# Patient Record
Sex: Female | Born: 1960
Health system: Southern US, Community
[De-identification: ages and names within clinical notes are randomized; demographics above are authoritative.]

## PROBLEM LIST (undated history)

## (undated) DIAGNOSIS — T7840XA Allergy, unspecified, initial encounter: Secondary | ICD-10-CM

## (undated) HISTORY — DX: Allergy, unspecified, initial encounter: T78.40XA

---

## 2001-08-23 ENCOUNTER — Encounter: Admission: RE | Admit: 2001-08-23 | Discharge: 2001-08-23 | Payer: Self-pay | Admitting: Family Medicine

## 2001-08-23 ENCOUNTER — Encounter: Payer: Self-pay | Admitting: Family Medicine

## 2002-02-14 ENCOUNTER — Other Ambulatory Visit: Admission: RE | Admit: 2002-02-14 | Discharge: 2002-02-14 | Payer: Self-pay | Admitting: Obstetrics

## 2002-11-13 ENCOUNTER — Encounter: Payer: Self-pay | Admitting: Family Medicine

## 2002-11-13 ENCOUNTER — Encounter: Admission: RE | Admit: 2002-11-13 | Discharge: 2002-11-13 | Payer: Self-pay | Admitting: Family Medicine

## 2002-11-27 ENCOUNTER — Emergency Department (HOSPITAL_COMMUNITY): Admission: EM | Admit: 2002-11-27 | Discharge: 2002-11-27 | Payer: Self-pay | Admitting: Emergency Medicine

## 2003-05-07 ENCOUNTER — Encounter: Payer: Self-pay | Admitting: Family Medicine

## 2003-05-07 ENCOUNTER — Encounter: Admission: RE | Admit: 2003-05-07 | Discharge: 2003-05-07 | Payer: Self-pay | Admitting: Family Medicine

## 2003-06-29 ENCOUNTER — Ambulatory Visit (HOSPITAL_COMMUNITY): Admission: RE | Admit: 2003-06-29 | Discharge: 2003-06-29 | Payer: Self-pay | Admitting: Obstetrics & Gynecology

## 2004-02-12 ENCOUNTER — Ambulatory Visit (HOSPITAL_BASED_OUTPATIENT_CLINIC_OR_DEPARTMENT_OTHER): Admission: RE | Admit: 2004-02-12 | Discharge: 2004-02-12 | Payer: Self-pay | Admitting: Orthopedic Surgery

## 2008-04-20 ENCOUNTER — Emergency Department (HOSPITAL_COMMUNITY): Admission: EM | Admit: 2008-04-20 | Discharge: 2008-04-20 | Payer: Self-pay | Admitting: Emergency Medicine

## 2008-04-22 ENCOUNTER — Emergency Department (HOSPITAL_COMMUNITY): Admission: EM | Admit: 2008-04-22 | Discharge: 2008-04-22 | Payer: Self-pay | Admitting: Emergency Medicine

## 2009-12-24 ENCOUNTER — Encounter
Admission: RE | Admit: 2009-12-24 | Discharge: 2009-12-24 | Payer: Self-pay | Source: Home / Self Care | Admitting: Family Medicine

## 2010-09-11 ENCOUNTER — Ambulatory Visit (HOSPITAL_BASED_OUTPATIENT_CLINIC_OR_DEPARTMENT_OTHER)
Admission: RE | Admit: 2010-09-11 | Discharge: 2010-09-11 | Disposition: A | Discharge status: Home / Self Care | Payer: BC Managed Care – PPO | Source: Ambulatory Visit | Attending: Urology | Admitting: Urology

## 2010-09-11 DIAGNOSIS — N3946 Mixed incontinence: Secondary | ICD-10-CM | POA: Insufficient documentation

## 2010-09-11 DIAGNOSIS — IMO0002 Reserved for concepts with insufficient information to code with codable children: Secondary | ICD-10-CM | POA: Insufficient documentation

## 2010-09-11 DIAGNOSIS — Z01810 Encounter for preprocedural cardiovascular examination: Secondary | ICD-10-CM | POA: Insufficient documentation

## 2010-09-11 DIAGNOSIS — N3010 Interstitial cystitis (chronic) without hematuria: Secondary | ICD-10-CM | POA: Insufficient documentation

## 2010-09-11 DIAGNOSIS — Z01812 Encounter for preprocedural laboratory examination: Secondary | ICD-10-CM | POA: Insufficient documentation

## 2010-09-11 LAB — POCT I-STAT 4, (NA,K, GLUC, HGB,HCT): HCT: 38 % (ref 36.0–46.0)

## 2010-09-25 NOTE — Op Note (Signed)
  NAMEBILLIE, TRAGER               ACCOUNT NO.:  [id number]  MEDICAL RECORD NO.:  [id number]          PATIENT TYPE:  INP  LOCATION:                               FACILITY:  [facility]  PHYSICIAN:  Martina Sinner, MD DATE OF BIRTH:  10/02/1960  DATE OF PROCEDURE: DATE OF DISCHARGE:                              OPERATIVE REPORT   PREOPERATIVE DIAGNOSIS:  Pelvic pain.  POSTOPERATIVE DIAGNOSIS:  Interstitial cystitis.  SURGERY:  Cystoscopy, hydrodistention of bladder, instillation therapy.  Ms. Losey has mixed stress and urge incontinence.  Both are mild.  She has pelvic pain and dyspareunia.  The patient was prepped and draped in usual fashion.  Leg position was good.  She had grade 1 or mild grade 2 cystocele that would not require repair.  She had a sling.  A 21-French scope was utilized.  Bladder mucosa and trigone were normal. There was no stitch foreign, body or carcinoma.  She was hydrodistented to 550 mL.  I emptied her bladder and reinspected her bladder and she had diffuse glomerulations keeping the diagnosis of interstitial cystitis.  Bladder was emptied.  As a separate procedure, a red rubber catheter was inserted.  I instilled 15 cc of 0.5% Marcaine plus 400 mg of Pyridium.  The patient will be treated for mixed stress urge incontinence and pelvic pain with physical therapy, behavioral therapy, and medication, hoping to stop medication in the future.          ______________________________ Martina Sinner, MD     SAM/MEDQ  D:  09/11/2010  T:  09/11/2010  Job:  [id number]  Electronically Signed by Alfredo Martinez MD on 09/25/2010 12:49:48 PM

## 2010-11-28 NOTE — Op Note (Signed)
NAME:  Selena Huff, Selena Huff                         ACCOUNT NO.:  [id number]   MEDICAL RECORD NO.:  [id number]                   PATIENT TYPE:  AMB   LOCATION:  DSC                                  FACILITY:  [facility]   PHYSICIAN:  Katy Fitch. Naaman Plummer., M.D.          DATE OF BIRTH:  11-24-1960   DATE OF PROCEDURE:  02/12/2004  DATE OF DISCHARGE:                                 OPERATIVE REPORT   PREOPERATIVE DIAGNOSES:  Clinical and MRI-proven retracted rotator cuff  tear, right shoulder, with significant acromioclavicular degenerative  arthritis and type 3 acromion.   POSTOPERATIVE DIAGNOSES:  Clinical and MRI-proven retracted rotator cuff  tear, right shoulder, with significant acromioclavicular degenerative  arthritis and type 3 acromion, with identification of degenerative tear of  labrum at 1 o'clock through 10 o'clock posteriorly.   OPERATION:  1. Diagnostic arthroscopy of right shoulder.  2. Arthroscopic debridement of labral and deep surface rotator cuff tears.  3. Open reconstruction of chronically-retracted right supra-spinatus and     infra-spinatus rotator cuff tear, with  bio-corkscrew anchor fixation x2,     and Revo titanium screw fixation into greater tuberosity.  4. Open resection of distal clavicle, i.e. Mumford procedure.   SURGEON:  Katy Fitch. Sypher, M.D.   ASSISTANT:  None.   ANESTHESIA:  General endotracheal supplemented by intra-Scalene block.   ANESTHESIOLOGIST:  Guadalupe Maple, M.D.   INDICATIONS FOR PROCEDURE:  The patient is Huff 50 year old woman referred by  Dr. Cindee Salt and Dr. Renaye Rakers, for the evaluation and management of Huff  painful right shoulder.  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  She was seen by Dr. Merlyn Lot in the  fall of 2004.  She had an x-ray evaluation of her shoulder demonstrating AC  arthropathy and signs of probable impingement.  She was treated with Huff sub-  acromial injection  of steroid, followed by Huff course of therapy.  She did not  improve.  Dr. Merlyn Lot then referred her for an MRI which documented Huff rather  substantial retracted rotator cuff tear.  He has subsequently referred her  for an upper extremity orthopedic consultation with myself.   The clinical examination revealed weakness of abduction, pain on abduction,  external rotation against resistance and pain on crossed __________  reduction consistent with impingement.  Plain films documented Huff type 3  acromion, AC arthropathy and signs of retracted rotator cuff tear with  sclerosis of the greater tuberosity of the humerus.  The MRI revealed Huff  large retracted rotator cuff tear.   INFORMED CONSENT:  After informed consent the patient is brought to the  operating room at this time for Huff reconstruction of her right shoulder.  Preoperatively questions were invited and answered.  She was informed of the  potential risks and benefits of the surgery in detail.   DESCRIPTION OF PROCEDURE:  The patient is brought  to the operating room and  placed in the supine position upon the operating room table.  An intra-  Scalene block placed in the holding area lead to complete anesthesia of the  right fore-quarter.  General orotracheal anesthesia was induced followed by  positioning the patient in the beach chair position with the aid of Huff torso  and head-holder to perform the arthroscopy.  The entire right upper  extremity and fore-quarter were prepped with DuraPrep and draped with  impervious arthroscopy drapes.  The procedure commenced with distention of  the shoulder with 20 mL of sterile saline brought in through Huff posterior  approach, utilizing Huff #20 gauge spinal needle.  The scope was placed  atraumatically, followed by Huff diagnostic arthroscopy of the glenohumeral  joint.  The hyaline articular cartilage surfaces on the humerus and glenoid  were normal.  The biceps tendon had Huff normal origin at the superior  labrum;  however, the labrum was rather degenerative with Huff blunt edge at 1 o'clock  anteriorly with Huff free fragment hanging, and significant degeneration was  noted from 1 o'clock anteriorly to 10 o'clock posteriorly.  The anterior  glenohumeral ligaments were inspected and found to be normal.  The sub-  scapularis tendon was normal.  The supra-spinatus and infra-spinatus had Huff  retracted degenerative tear that was pulled off the greater tuberosity Huff  minimum of 20 mm.  There was Huff large osteophyte at the greater tuberosity  that had formed, due to chronic impingement beneath the [facility] joint.  Huff suction shaver was brought in through an anterior portal and used to  debride the labrum and deep surface of the rotator cuff tear.  After  assuring an intact biceps tendon through the rotator interval, the  arthroscopic equipment was removed, followed by progressing to an open  rotator cuff repair.  Huff 5 cm incision was fashioned at the distal clavicle  to the anterior middle deltoid junction laterally.  The [facility] joint was  identified and the distal 15 mm of clavicle exposed subperiosteally with the  #15 blade and Huff small osteotome.  The baby Willeen Cass retractors were placed,  followed by the use of an oscillating saw to remove the distal 15 mm of  clavicle.  There was considerable degeneration at the articular surface with  Huff large inferior spur.  The anterior deltoid was taken carefully off of the  anterior acromion subperiosteally, followed by the release of the  coracoacromial ligament.  The bursa was incised, revealing Huff large retracted  cuff tear.  Care was taken to identify and protect the biceps tendon.  The  cuff tear margins were freshened with Huff #15 scalpel blade, followed by the  use of Huff power bur to lower the profile of the tuberosity 3 to 4 mm deep to  the insertion of the infra-spinatus and supra-spinatus, followed by the use of Huff rongeur, removing the large lateral osteophyte that had  formed.  Two Arthrax bicortical screw anchors were placed medially to create Huff proper  footprint for cuff repair, followed by use of Huff titanium Revo screw  laterally, due to extremely dense bone at the site of chronic impingement.  Huff #2 fiber wire was placed with Huff grasping technique and placed through bone  in Huff McLaughlin fashion, to advance the rotator cuff laterally to its proper  insertion point.  Huff total of six mattress sutures were then placed,  insetting the supra-spinatus and infra-spinatus onto the decorticated area  of the greater tuberosity.  Huff very satisfactory profile was achieved.  Huff  finishing suture along the margin was used to smooth the inset point at the  greater tuberosity.  The bursa was then reapproximated with repair of the  anterior deltoid, with mattress sutures of #2 fiber wire, closing the dead  space created by distal clavicle resection, and securely repairing the  anterior deltoid with three bone sutures to the acromion.  The deltoid split  laterally was repaired with Huff single simple #0 Vicryl suture.  The deltoid  was not split more than 1 cm.  The wound was thoroughly lavaged with sterile  saline, followed by Huff repair of the subcutaneous tissues with #0 Vicryl and  #2-0 Vicryl with knots buried, followed by Huff repair of the skin with  intradermal #3-0 Prolene and Steri-Strips.  Huff pressure dressing was applied  with Xeroflo, sterile gauze and Hypafix. There were no apparent  complications.  The patient tolerated the surgery and the anesthesia well.  She was  transferred to the recovery room with stable vital signs.   AFTER-CARE:  She will be admitted to the recovery care center for  observation of her vital signs, IV antibiotics in the form of Ancef 1 g IV  q.8h. and appropriate analgesics.                                               Katy Fitch Naaman Plummer., M.D.    RVS/MEDQ  D:  02/12/2004  T:  02/12/2004  Job:  [id number]   cc:   Cindee Salt, M.D.  [address]  [place]  [place] [address]  Fax: [phone]   Renaye Rakers, M.D.  [phone] [address]  [place]  [place] [address]  Fax: [phone]

## 2011-04-13 LAB — POCT PREGNANCY, URINE: Preg Test, Ur: NEGATIVE

## 2012-04-22 ENCOUNTER — Other Ambulatory Visit (HOSPITAL_COMMUNITY)
Admission: RE | Admit: 2012-04-22 | Discharge: 2012-04-22 | Disposition: A | Discharge status: Home / Self Care | Payer: BC Managed Care – PPO | Source: Ambulatory Visit | Attending: Family Medicine | Admitting: Family Medicine

## 2012-04-22 DIAGNOSIS — Z1151 Encounter for screening for human papillomavirus (HPV): Secondary | ICD-10-CM | POA: Insufficient documentation

## 2012-04-22 DIAGNOSIS — Z01419 Encounter for gynecological examination (general) (routine) without abnormal findings: Secondary | ICD-10-CM | POA: Insufficient documentation

## 2012-07-26 ENCOUNTER — Other Ambulatory Visit: Payer: Self-pay | Admitting: Family Medicine

## 2012-07-26 ENCOUNTER — Ambulatory Visit
Admission: RE | Admit: 2012-07-26 | Discharge: 2012-07-26 | Disposition: A | Discharge status: Home / Self Care | Payer: BC Managed Care – PPO | Source: Ambulatory Visit | Attending: Family Medicine | Admitting: Family Medicine

## 2012-07-26 DIAGNOSIS — R52 Pain, unspecified: Secondary | ICD-10-CM

## 2013-02-27 ENCOUNTER — Other Ambulatory Visit (HOSPITAL_COMMUNITY): Payer: Self-pay | Admitting: Orthopedic Surgery

## 2013-02-27 ENCOUNTER — Ambulatory Visit (HOSPITAL_COMMUNITY)
Admission: RE | Admit: 2013-02-27 | Discharge: 2013-02-27 | Disposition: A | Discharge status: Home / Self Care | Payer: BC Managed Care – PPO | Source: Ambulatory Visit | Attending: Orthopedic Surgery | Admitting: Orthopedic Surgery

## 2013-02-27 DIAGNOSIS — R52 Pain, unspecified: Secondary | ICD-10-CM

## 2013-02-27 DIAGNOSIS — Z8639 Personal history of other endocrine, nutritional and metabolic disease: Secondary | ICD-10-CM | POA: Insufficient documentation

## 2013-02-27 DIAGNOSIS — Z862 Personal history of diseases of the blood and blood-forming organs and certain disorders involving the immune mechanism: Secondary | ICD-10-CM | POA: Insufficient documentation

## 2013-02-27 DIAGNOSIS — M79609 Pain in unspecified limb: Secondary | ICD-10-CM | POA: Insufficient documentation

## 2013-04-27 ENCOUNTER — Other Ambulatory Visit (HOSPITAL_COMMUNITY)
Admission: RE | Admit: 2013-04-27 | Discharge: 2013-04-27 | Disposition: A | Discharge status: Home / Self Care | Payer: BC Managed Care – PPO | Source: Ambulatory Visit | Attending: Family Medicine | Admitting: Family Medicine

## 2013-04-27 DIAGNOSIS — Z01419 Encounter for gynecological examination (general) (routine) without abnormal findings: Secondary | ICD-10-CM | POA: Insufficient documentation

## 2013-04-27 DIAGNOSIS — Z1151 Encounter for screening for human papillomavirus (HPV): Secondary | ICD-10-CM | POA: Insufficient documentation

## 2013-09-11 ENCOUNTER — Ambulatory Visit
Admission: RE | Admit: 2013-09-11 | Discharge: 2013-09-11 | Disposition: A | Discharge status: Home / Self Care | Payer: BC Managed Care – PPO | Source: Ambulatory Visit | Attending: Family Medicine | Admitting: Family Medicine

## 2013-09-11 ENCOUNTER — Other Ambulatory Visit: Payer: Self-pay | Admitting: Family Medicine

## 2013-09-11 DIAGNOSIS — M79673 Pain in unspecified foot: Secondary | ICD-10-CM

## 2014-05-08 ENCOUNTER — Other Ambulatory Visit (HOSPITAL_COMMUNITY)
Admission: RE | Admit: 2014-05-08 | Discharge: 2014-05-08 | Disposition: A | Discharge status: Home / Self Care | Payer: BC Managed Care – PPO | Source: Ambulatory Visit | Attending: Family Medicine | Admitting: Family Medicine

## 2014-05-08 DIAGNOSIS — Z01419 Encounter for gynecological examination (general) (routine) without abnormal findings: Secondary | ICD-10-CM | POA: Insufficient documentation

## 2014-05-08 DIAGNOSIS — Z1151 Encounter for screening for human papillomavirus (HPV): Secondary | ICD-10-CM | POA: Insufficient documentation

## 2014-09-17 ENCOUNTER — Telehealth: Payer: Self-pay | Admitting: Hematology

## 2014-09-17 NOTE — Telephone Encounter (Signed)
LT MESS REGARDING  NW PT APPT DX:  IRON DEFICIENCY/ANEMIA REFERRING: DR.VEITA BLAND

## 2014-09-18 ENCOUNTER — Telehealth: Payer: Self-pay | Admitting: Oncology

## 2014-09-18 NOTE — Telephone Encounter (Signed)
CHART DELIVERED ON 09/18/14.  TG

## 2014-10-17 ENCOUNTER — Telehealth: Payer: Self-pay | Admitting: *Deleted

## 2014-10-17 NOTE — Telephone Encounter (Signed)
This RN left a message on patient's cell phone reminding her of her appointment tomorrow with Dr. Alen Blew.

## 2014-10-18 ENCOUNTER — Ambulatory Visit: Payer: BLUE CROSS/BLUE SHIELD

## 2014-10-18 ENCOUNTER — Encounter: Payer: Self-pay | Admitting: Oncology

## 2014-10-18 ENCOUNTER — Telehealth: Payer: Self-pay | Admitting: Oncology

## 2014-10-18 ENCOUNTER — Other Ambulatory Visit (HOSPITAL_BASED_OUTPATIENT_CLINIC_OR_DEPARTMENT_OTHER): Payer: BLUE CROSS/BLUE SHIELD

## 2014-10-18 ENCOUNTER — Other Ambulatory Visit: Payer: Self-pay

## 2014-10-18 ENCOUNTER — Ambulatory Visit (HOSPITAL_BASED_OUTPATIENT_CLINIC_OR_DEPARTMENT_OTHER): Payer: BLUE CROSS/BLUE SHIELD | Admitting: Oncology

## 2014-10-18 VITALS — BP 141/72 | HR 77 | Temp 98.5°F | Resp 18 | Ht 65.0 in | Wt 211.1 lb

## 2014-10-18 DIAGNOSIS — N920 Excessive and frequent menstruation with regular cycle: Secondary | ICD-10-CM | POA: Diagnosis not present

## 2014-10-18 DIAGNOSIS — D500 Iron deficiency anemia secondary to blood loss (chronic): Secondary | ICD-10-CM

## 2014-10-18 LAB — CBC WITH DIFFERENTIAL/PLATELET
BASO%: 0.2 % (ref 0.0–2.0)
Basophils Absolute: 0 10*3/uL (ref 0.0–0.1)
EOS ABS: 0 10*3/uL (ref 0.0–0.5)
EOS%: 0.5 % (ref 0.0–7.0)
HCT: 35.6 % (ref 34.8–46.6)
HEMOGLOBIN: 11.6 g/dL (ref 11.6–15.9)
LYMPH%: 46.3 % (ref 14.0–49.7)
MCH: 29.4 pg (ref 25.1–34.0)
MCHC: 32.6 g/dL (ref 31.5–36.0)
MCV: 90.4 fL (ref 79.5–101.0)
MONO#: 0.4 10*3/uL (ref 0.1–0.9)
MONO%: 6.7 % (ref 0.0–14.0)
NEUT%: 46.3 % (ref 38.4–76.8)
NEUTROS ABS: 2.8 10*3/uL (ref 1.5–6.5)
NRBC: 0 % (ref 0–0)
Platelets: 154 10*3/uL (ref 145–400)
RBC: 3.94 10*6/uL (ref 3.70–5.45)
RDW: 14.6 % — AB (ref 11.2–14.5)
WBC: 6 10*3/uL (ref 3.9–10.3)
lymph#: 2.8 10*3/uL (ref 0.9–3.3)

## 2014-10-18 LAB — COMPREHENSIVE METABOLIC PANEL (CC13)
ALK PHOS: 74 U/L (ref 40–150)
ALT: 22 U/L (ref 0–55)
ANION GAP: 10 meq/L (ref 3–11)
AST: 24 U/L (ref 5–34)
Albumin: 3.6 g/dL (ref 3.5–5.0)
BILIRUBIN TOTAL: 0.25 mg/dL (ref 0.20–1.20)
BUN: 11.5 mg/dL (ref 7.0–26.0)
CO2: 25 mEq/L (ref 22–29)
Calcium: 8.7 mg/dL (ref 8.4–10.4)
Chloride: 110 mEq/L — ABNORMAL HIGH (ref 98–109)
Creatinine: 0.7 mg/dL (ref 0.6–1.1)
GLUCOSE: 100 mg/dL (ref 70–140)
Potassium: 3.8 mEq/L (ref 3.5–5.1)
SODIUM: 144 meq/L (ref 136–145)
Total Protein: 6.8 g/dL (ref 6.4–8.3)

## 2014-10-18 LAB — FERRITIN CHCC: Ferritin: 79 ng/ml (ref 9–269)

## 2014-10-18 NOTE — Consult Note (Signed)
Reason for Referral: Anemia.   HPI: 54 year old woman currently of [place] where she lived the majority of her life. She is a rather healthy woman with history of diabetes and allergies. She was noted to be anemic with iron deficiency 6 months ago. She was started on iron supplements in the form of fusion plus capsules once a day since that time. Repeat laboratory testing on 09/08/2014 show that her iron level was 51 with a saturation of 17%. Her reticulocyte count was 1.8 which is normal. Her B12 level were normal at 161 with folic acid which was normal. Her ferritin level was 53. She was referred to me for persistent iron deficiency anemia. Clinically she is asymptomatic. She has not reported any fatigue or tiredness associated with it. She did not report any dyspnea on exertion or shortness of breath. She did not report any hematochezia or melena and have had a colonoscopy in 2013. She does not report any constitutional symptoms. She continues to have menstrual cycles of her once a year for the last few years. She was diagnosed with uterine fibroids but have not had really severe menorrhagia.  She does not report any headaches, blurry vision, syncope or seizures. She does not report any fevers, chills, sweats or weight loss. She does not report any chest pain, palpitation, orthopnea or leg edema. She does not report any cough or hemoptysis or hematemesis. She does not report any nausea, vomiting, abdominal pain, as well as satiety. She does not report any frequency urgency or hesitancy. She does not report any skeletal complaints. Remaining review of systems unremarkable.   Past Medical History  Diagnosis Date  . Allergy   :  No past surgical history on file.:   Current outpatient prescriptions:  .  ascorbic acid (VITAMIN C) 1000 MG tablet, Take 1,000 mg by mouth daily., Disp: , Rfl:  .  fluticasone (FLONASE) 50 MCG/ACT nasal spray, 1 spray. One spray in each nostril twice a day, Disp: , Rfl:   .  Iron-FA-B Cmp-C-Biot-Probiotic (FUSION PLUS) CAPS, Take 1 capsule by mouth daily., Disp: , Rfl:  .  losartan (COZAAR) 50 MG tablet, Take 50 mg by mouth daily., Disp: , Rfl:  .  meloxicam (MOBIC) 15 MG tablet, Take 15 mg by mouth daily., Disp: , Rfl:  .  metFORMIN (GLUCOPHAGE) 500 MG tablet, Take 2 tablets by mouth daily., Disp: , Rfl: :  Allergies not on file:  No family history on file.:  History   Social History  . Marital Status: Single    Spouse Name: N/A  . Number of Children: N/A  . Years of Education: N/A   Occupational History  . Not on file.   Social History Main Topics  . Smoking status: Not on file  . Smokeless tobacco: Not on file  . Alcohol Use: Not on file  . Drug Use: Not on file  . Sexual Activity: Not on file   Other Topics Concern  . Not on file   Social History Narrative  . No narrative on file  :  Pertinent items are noted in HPI.  Exam: ECOG 0 Blood pressure 141/72, pulse 77, temperature 98.5 F (36.9 C), temperature source Oral, resp. rate 18, height 5\' 5"  (1.651 m), weight 211 lb 1.6 oz (95.754 kg), SpO2 100 %. General appearance: alert and cooperative Head: Normocephalic, without obvious abnormality Throat: lips, mucosa, and tongue normal; teeth and gums normal Neck: no adenopathy Back: negative Resp: clear to auscultation bilaterally Chest wall: no tenderness Cardio:  regular rate and rhythm, S1, S2 normal, no murmur, click, rub or gallop GI: soft, non-tender; bowel sounds normal; no masses,  no organomegaly Extremities: extremities normal, atraumatic, no cyanosis or edema Pulses: 2+ and symmetric Skin: Skin color, texture, turgor normal. No rashes or lesions Lymph nodes: Cervical, supraclavicular, and axillary nodes normal.  CBC    Component Value Date/Time   HGB 12.9 09/11/2010 1036   HCT 38.0 09/11/2010 1036      Chemistry      Component Value Date/Time   NA 140 09/11/2010 1036   K 4.3 09/11/2010 1036   No results found  for: CALCIUM, ALKPHOS, AST, ALT, BILITOT     Assessment and Plan:   53 year old woman with presumed iron deficiency anemia with minerals posterior oral iron. The differential diagnosis of her anemia could be related to iron deficiency from menorrhagia, anemia of chronic disease, and other causes. It is certainly possible that she has mild iron deficiency related to poor oral absorption of her iron replacement. It is possible that she might require increase the dose or possibly IV iron.  I will obtain CBC and iron studies and encouraged her to continue oral iron for the time being. Anderson 3 months her iron studies do not improve I will offer her IV iron at that time. I discussed with her the logistics of IV iron administration at this time and the complications associated with it.  If his hemoglobin continues to be low despite adequate iron replacement we will entertain other possibilities such as anemia of chronic disease, plasma cell disorder, myelodysplasia which are unlikely at this time.  Follow-up will be in 3 months to follow her progress.

## 2014-10-18 NOTE — Progress Notes (Signed)
Please see consult note.  

## 2014-10-18 NOTE — Telephone Encounter (Signed)
gave and prnted appt sched and avs for pt for July

## 2014-10-18 NOTE — Progress Notes (Signed)
Checked in new pt with no financial concerns. °

## 2015-01-16 ENCOUNTER — Other Ambulatory Visit (HOSPITAL_BASED_OUTPATIENT_CLINIC_OR_DEPARTMENT_OTHER): Payer: BLUE CROSS/BLUE SHIELD

## 2015-01-16 DIAGNOSIS — D500 Iron deficiency anemia secondary to blood loss (chronic): Secondary | ICD-10-CM | POA: Diagnosis not present

## 2015-01-16 LAB — CBC WITH DIFFERENTIAL/PLATELET
BASO%: 0.3 % (ref 0.0–2.0)
BASOS ABS: 0 10*3/uL (ref 0.0–0.1)
EOS%: 0.4 % (ref 0.0–7.0)
Eosinophils Absolute: 0 10*3/uL (ref 0.0–0.5)
HEMATOCRIT: 39.6 % (ref 34.8–46.6)
HEMOGLOBIN: 13 g/dL (ref 11.6–15.9)
LYMPH#: 2.7 10*3/uL (ref 0.9–3.3)
LYMPH%: 40.9 % (ref 14.0–49.7)
MCH: 29.7 pg (ref 25.1–34.0)
MCHC: 32.9 g/dL (ref 31.5–36.0)
MCV: 90.2 fL (ref 79.5–101.0)
MONO#: 0.4 10*3/uL (ref 0.1–0.9)
MONO%: 6.7 % (ref 0.0–14.0)
NEUT#: 3.4 10*3/uL (ref 1.5–6.5)
NEUT%: 51.7 % (ref 38.4–76.8)
Platelets: 191 10*3/uL (ref 145–400)
RBC: 4.38 10*6/uL (ref 3.70–5.45)
RDW: 15.1 % — ABNORMAL HIGH (ref 11.2–14.5)
WBC: 6.5 10*3/uL (ref 3.9–10.3)

## 2015-01-17 ENCOUNTER — Ambulatory Visit (HOSPITAL_BASED_OUTPATIENT_CLINIC_OR_DEPARTMENT_OTHER): Payer: BLUE CROSS/BLUE SHIELD | Admitting: Oncology

## 2015-01-17 VITALS — BP 138/72 | HR 79 | Temp 97.8°F | Resp 18 | Ht 65.0 in | Wt 208.5 lb

## 2015-01-17 DIAGNOSIS — D259 Leiomyoma of uterus, unspecified: Secondary | ICD-10-CM | POA: Diagnosis not present

## 2015-01-17 DIAGNOSIS — D500 Iron deficiency anemia secondary to blood loss (chronic): Secondary | ICD-10-CM

## 2015-01-17 DIAGNOSIS — N920 Excessive and frequent menstruation with regular cycle: Secondary | ICD-10-CM

## 2015-01-17 DIAGNOSIS — D509 Iron deficiency anemia, unspecified: Secondary | ICD-10-CM | POA: Diagnosis not present

## 2015-01-17 LAB — IRON AND TIBC CHCC
%SAT: 21 % (ref 21–57)
IRON: 53 ug/dL (ref 41–142)
TIBC: 260 ug/dL (ref 236–444)
UIBC: 207 ug/dL (ref 120–384)

## 2015-01-17 LAB — FERRITIN CHCC: Ferritin: 76 ng/ml (ref 9–269)

## 2015-01-17 NOTE — Progress Notes (Signed)
Hematology and Oncology Follow Up Visit  Selena Huff [id number] 1961/03/17 54 y.o. 01/17/2015 3:56 PM Selena Gourd, MD   Principle Diagnosis: 55 year old woman with iron deficiency anemia diagnosed in November 2015. At that time her iron level was 51 with saturation of 17%. This is likely related to menorrhagia.  Current therapy:  Oral iron replacement on a daily basis the last 6 months.  Interim History:  Selena Huff presents today for a follow-up visit. Since her last visit, she continues to be on iron supplements and have tolerated it well. She does not report any nausea or dyspepsia. She does not report any constipation or change in her bowel habits. She has not reported any major changes in her performance status or activity level. She does report mild fatigue. She does have occasional heavy menses periodically.  She does not report any headaches, blurry vision, syncope or seizures. She does not report any fevers, chills, sweats or weight loss. She does not report any chest pain, palpitation, orthopnea or leg edema. She does not report any cough or hemoptysis or hematemesis. She does not report any nausea, vomiting, abdominal pain, as well as satiety. She does not report any frequency urgency or hesitancy. She does not report any skeletal complaints. Remaining review of systems unremarkable.   Medications: I have reviewed the patient's current medications.  Current Outpatient Prescriptions  Medication Sig Dispense Refill  . ascorbic acid (VITAMIN C) 1000 MG tablet Take 1,000 mg by mouth daily.    . fluticasone (FLONASE) 50 MCG/ACT nasal spray 1 spray. One spray in each nostril twice a day    . Iron-FA-B Cmp-C-Biot-Probiotic (FUSION PLUS) CAPS Take 1 capsule by mouth daily.    Marland Kitchen losartan (COZAAR) 50 MG tablet Take 50 mg by mouth daily.    . meloxicam (MOBIC) 15 MG tablet Take 15 mg by mouth daily.    . metFORMIN (GLUCOPHAGE) 500 MG tablet Take 2 tablets by mouth daily.     . montelukast (SINGULAIR) 10 MG tablet Take 10 mg by mouth daily.     No current facility-administered medications for this visit.     Allergies:  Allergies  Allergen Reactions  . Voltaren [Diclofenac Sodium] Hives, Itching and Rash    Past Medical History, Surgical history, Social history, and Family History were reviewed and updated.   Physical Exam: Blood pressure 138/72, pulse 79, temperature 97.8 F (36.6 C), temperature source Oral, resp. rate 18, height 5\' 5"  (1.651 m), weight 208 lb 8 oz (94.575 kg), SpO2 100 %. ECOG: 0 General appearance: alert and cooperative Head: Normocephalic, without obvious abnormality Neck: no adenopathy Lymph nodes: Cervical, supraclavicular, and axillary nodes normal. Heart:regular rate and rhythm, S1, S2 normal, no murmur, click, rub or gallop Lung:chest clear, no wheezing, rales, normal symmetric air entry Abdomin: soft, non-tender, without masses or organomegaly EXT:no erythema, induration, or nodules   Lab Results: Lab Results  Component Value Date   WBC 6.5 01/16/2015   HGB 13.0 01/16/2015   HCT 39.6 01/16/2015   MCV 90.2 01/16/2015   PLT 191 01/16/2015     Chemistry      Component Value Date/Time   NA 144 10/18/2014 1426   NA 140 09/11/2010 1036   K 3.8 10/18/2014 1426   K 4.3 09/11/2010 1036   CO2 25 10/18/2014 1426   BUN 11.5 10/18/2014 1426   CREATININE 0.7 10/18/2014 1426      Component Value Date/Time   CALCIUM 8.7 10/18/2014 1426   ALKPHOS 74 10/18/2014  1426   AST 24 10/18/2014 1426   ALT 22 10/18/2014 1426   BILITOT 0.25 10/18/2014 1426     Results for Selena Huff (MRN [id number]) as of 01/17/2015 15:57  Ref. Range 01/16/2015 15:51  Iron Latest Ref Range: 41-142 ug/dL 53  UIBC Latest Ref Range: 120-384 ug/dL 207  TIBC Latest Ref Range: 236-444 ug/dL 260  %SAT Latest Ref Range: 21-57 % 21  Ferritin Latest Ref Range: 9-269 ng/ml 76     Impression and Plan:  54 year old woman with:  1. Iron  deficiency anemia likely related to menstrual blood losses. She has tolerated oral iron well with normalization of her iron studies which were reviewed today. Her iron level was up to 53 with ferritin of 76. I have recommended continuing oral iron supplements as long as she is having menstrual blood losses. This can be discontinued once her menstrual losses subside.  2. Uterine fibroids: Likely causing her menorrhagia which have really subsided dramatically in the last few years.  3. Follow-up: Will be as needed and I'll be happy to see her in the future if needed to.   Selena Button, MD 7/7/20163:56 PM

## 2015-04-01 ENCOUNTER — Encounter (INDEPENDENT_AMBULATORY_CARE_PROVIDER_SITE_OTHER): Payer: BLUE CROSS/BLUE SHIELD | Admitting: Ophthalmology

## 2015-04-01 DIAGNOSIS — I10 Essential (primary) hypertension: Secondary | ICD-10-CM | POA: Diagnosis not present

## 2015-04-01 DIAGNOSIS — H43813 Vitreous degeneration, bilateral: Secondary | ICD-10-CM

## 2015-04-01 DIAGNOSIS — H35033 Hypertensive retinopathy, bilateral: Secondary | ICD-10-CM

## 2015-04-01 DIAGNOSIS — H35411 Lattice degeneration of retina, right eye: Secondary | ICD-10-CM

## 2015-05-14 ENCOUNTER — Other Ambulatory Visit (HOSPITAL_COMMUNITY)
Admission: RE | Admit: 2015-05-14 | Discharge: 2015-05-14 | Disposition: A | Discharge status: Home / Self Care | Payer: BLUE CROSS/BLUE SHIELD | Source: Ambulatory Visit | Attending: Family Medicine | Admitting: Family Medicine

## 2015-05-14 DIAGNOSIS — Z01419 Encounter for gynecological examination (general) (routine) without abnormal findings: Secondary | ICD-10-CM | POA: Diagnosis present

## 2015-05-14 DIAGNOSIS — Z1151 Encounter for screening for human papillomavirus (HPV): Secondary | ICD-10-CM | POA: Diagnosis not present

## 2015-07-30 ENCOUNTER — Other Ambulatory Visit: Payer: Self-pay | Admitting: Family Medicine

## 2015-07-30 DIAGNOSIS — R1310 Dysphagia, unspecified: Secondary | ICD-10-CM

## 2015-07-31 ENCOUNTER — Ambulatory Visit
Admission: RE | Admit: 2015-07-31 | Discharge: 2015-07-31 | Disposition: A | Discharge status: Home / Self Care | Payer: BLUE CROSS/BLUE SHIELD | Source: Ambulatory Visit | Attending: Family Medicine | Admitting: Family Medicine

## 2015-07-31 DIAGNOSIS — R1310 Dysphagia, unspecified: Secondary | ICD-10-CM

## 2015-10-17 DIAGNOSIS — H8302 Labyrinthitis, left ear: Secondary | ICD-10-CM | POA: Diagnosis not present

## 2015-10-17 DIAGNOSIS — Z6835 Body mass index (BMI) 35.0-35.9, adult: Secondary | ICD-10-CM | POA: Diagnosis not present

## 2015-10-17 DIAGNOSIS — J399 Disease of upper respiratory tract, unspecified: Secondary | ICD-10-CM | POA: Diagnosis not present

## 2015-10-31 DIAGNOSIS — I10 Essential (primary) hypertension: Secondary | ICD-10-CM | POA: Diagnosis not present

## 2015-10-31 DIAGNOSIS — E785 Hyperlipidemia, unspecified: Secondary | ICD-10-CM | POA: Diagnosis not present

## 2015-10-31 DIAGNOSIS — J399 Disease of upper respiratory tract, unspecified: Secondary | ICD-10-CM | POA: Diagnosis not present

## 2015-10-31 DIAGNOSIS — Z6834 Body mass index (BMI) 34.0-34.9, adult: Secondary | ICD-10-CM | POA: Diagnosis not present

## 2016-01-11 DIAGNOSIS — R7309 Other abnormal glucose: Secondary | ICD-10-CM | POA: Diagnosis not present

## 2016-01-11 DIAGNOSIS — I10 Essential (primary) hypertension: Secondary | ICD-10-CM | POA: Diagnosis not present

## 2016-01-11 DIAGNOSIS — M130 Polyarthritis, unspecified: Secondary | ICD-10-CM | POA: Diagnosis not present

## 2016-01-13 DIAGNOSIS — E119 Type 2 diabetes mellitus without complications: Secondary | ICD-10-CM | POA: Diagnosis not present

## 2016-01-13 DIAGNOSIS — E785 Hyperlipidemia, unspecified: Secondary | ICD-10-CM | POA: Diagnosis not present

## 2016-01-13 DIAGNOSIS — M130 Polyarthritis, unspecified: Secondary | ICD-10-CM | POA: Diagnosis not present

## 2016-01-13 DIAGNOSIS — I10 Essential (primary) hypertension: Secondary | ICD-10-CM | POA: Diagnosis not present

## 2016-02-24 DIAGNOSIS — M130 Polyarthritis, unspecified: Secondary | ICD-10-CM | POA: Diagnosis not present

## 2016-02-24 DIAGNOSIS — E785 Hyperlipidemia, unspecified: Secondary | ICD-10-CM | POA: Diagnosis not present

## 2016-02-24 DIAGNOSIS — J301 Allergic rhinitis due to pollen: Secondary | ICD-10-CM | POA: Diagnosis not present

## 2016-02-24 DIAGNOSIS — I10 Essential (primary) hypertension: Secondary | ICD-10-CM | POA: Diagnosis not present

## 2016-03-03 DIAGNOSIS — M545 Low back pain: Secondary | ICD-10-CM | POA: Diagnosis not present

## 2016-03-03 DIAGNOSIS — R760 Raised antibody titer: Secondary | ICD-10-CM | POA: Diagnosis not present

## 2016-03-03 DIAGNOSIS — M5410 Radiculopathy, site unspecified: Secondary | ICD-10-CM | POA: Diagnosis not present

## 2016-04-28 DIAGNOSIS — I10 Essential (primary) hypertension: Secondary | ICD-10-CM | POA: Diagnosis not present

## 2016-04-28 DIAGNOSIS — E785 Hyperlipidemia, unspecified: Secondary | ICD-10-CM | POA: Diagnosis not present

## 2016-05-23 DIAGNOSIS — I10 Essential (primary) hypertension: Secondary | ICD-10-CM | POA: Diagnosis not present

## 2016-05-23 DIAGNOSIS — D649 Anemia, unspecified: Secondary | ICD-10-CM | POA: Diagnosis not present

## 2016-05-23 DIAGNOSIS — R7309 Other abnormal glucose: Secondary | ICD-10-CM | POA: Diagnosis not present

## 2016-05-23 DIAGNOSIS — Z1231 Encounter for screening mammogram for malignant neoplasm of breast: Secondary | ICD-10-CM | POA: Diagnosis not present

## 2016-05-26 ENCOUNTER — Other Ambulatory Visit: Payer: Self-pay | Admitting: Family Medicine

## 2016-05-26 ENCOUNTER — Other Ambulatory Visit (HOSPITAL_COMMUNITY)
Admission: RE | Admit: 2016-05-26 | Discharge: 2016-05-26 | Disposition: A | Discharge status: Home / Self Care | Payer: BLUE CROSS/BLUE SHIELD | Source: Ambulatory Visit | Attending: Family Medicine | Admitting: Family Medicine

## 2016-05-26 DIAGNOSIS — Z1151 Encounter for screening for human papillomavirus (HPV): Secondary | ICD-10-CM | POA: Insufficient documentation

## 2016-05-26 DIAGNOSIS — Z0000 Encounter for general adult medical examination without abnormal findings: Secondary | ICD-10-CM | POA: Diagnosis not present

## 2016-05-26 DIAGNOSIS — Z01419 Encounter for gynecological examination (general) (routine) without abnormal findings: Secondary | ICD-10-CM | POA: Insufficient documentation

## 2016-05-29 LAB — CYTOLOGY - PAP
Diagnosis: NEGATIVE
HPV: NOT DETECTED

## 2016-06-09 DIAGNOSIS — M545 Low back pain: Secondary | ICD-10-CM | POA: Diagnosis not present

## 2016-06-11 DIAGNOSIS — R5382 Chronic fatigue, unspecified: Secondary | ICD-10-CM | POA: Diagnosis not present

## 2016-06-11 DIAGNOSIS — M545 Low back pain: Secondary | ICD-10-CM | POA: Diagnosis not present

## 2016-06-11 DIAGNOSIS — R760 Raised antibody titer: Secondary | ICD-10-CM | POA: Diagnosis not present

## 2016-06-16 DIAGNOSIS — M545 Low back pain: Secondary | ICD-10-CM | POA: Diagnosis not present

## 2016-06-23 DIAGNOSIS — M545 Low back pain: Secondary | ICD-10-CM | POA: Diagnosis not present

## 2016-07-02 DIAGNOSIS — E785 Hyperlipidemia, unspecified: Secondary | ICD-10-CM | POA: Diagnosis not present

## 2016-07-02 DIAGNOSIS — I10 Essential (primary) hypertension: Secondary | ICD-10-CM | POA: Diagnosis not present

## 2016-07-02 DIAGNOSIS — M130 Polyarthritis, unspecified: Secondary | ICD-10-CM | POA: Diagnosis not present

## 2016-08-28 DIAGNOSIS — E785 Hyperlipidemia, unspecified: Secondary | ICD-10-CM | POA: Diagnosis not present

## 2016-08-28 DIAGNOSIS — E119 Type 2 diabetes mellitus without complications: Secondary | ICD-10-CM | POA: Diagnosis not present

## 2016-08-28 DIAGNOSIS — I10 Essential (primary) hypertension: Secondary | ICD-10-CM | POA: Diagnosis not present

## 2016-08-28 DIAGNOSIS — J301 Allergic rhinitis due to pollen: Secondary | ICD-10-CM | POA: Diagnosis not present

## 2016-09-01 DIAGNOSIS — M19072 Primary osteoarthritis, left ankle and foot: Secondary | ICD-10-CM | POA: Diagnosis not present

## 2016-10-01 DIAGNOSIS — M25551 Pain in right hip: Secondary | ICD-10-CM | POA: Diagnosis not present

## 2016-10-01 DIAGNOSIS — M25552 Pain in left hip: Secondary | ICD-10-CM | POA: Diagnosis not present

## 2016-10-01 DIAGNOSIS — R768 Other specified abnormal immunological findings in serum: Secondary | ICD-10-CM | POA: Diagnosis not present

## 2016-10-01 DIAGNOSIS — M791 Myalgia: Secondary | ICD-10-CM | POA: Diagnosis not present

## 2016-11-11 DIAGNOSIS — I10 Essential (primary) hypertension: Secondary | ICD-10-CM | POA: Diagnosis not present

## 2016-11-11 DIAGNOSIS — K580 Irritable bowel syndrome with diarrhea: Secondary | ICD-10-CM | POA: Diagnosis not present

## 2017-01-04 DIAGNOSIS — M19072 Primary osteoarthritis, left ankle and foot: Secondary | ICD-10-CM | POA: Diagnosis not present

## 2017-01-04 DIAGNOSIS — M130 Polyarthritis, unspecified: Secondary | ICD-10-CM | POA: Diagnosis not present

## 2017-01-04 DIAGNOSIS — E119 Type 2 diabetes mellitus without complications: Secondary | ICD-10-CM | POA: Diagnosis not present

## 2017-01-04 DIAGNOSIS — I10 Essential (primary) hypertension: Secondary | ICD-10-CM | POA: Diagnosis not present

## 2017-01-04 DIAGNOSIS — D500 Iron deficiency anemia secondary to blood loss (chronic): Secondary | ICD-10-CM | POA: Diagnosis not present

## 2017-02-04 DIAGNOSIS — E119 Type 2 diabetes mellitus without complications: Secondary | ICD-10-CM | POA: Diagnosis not present

## 2017-02-04 DIAGNOSIS — M19072 Primary osteoarthritis, left ankle and foot: Secondary | ICD-10-CM | POA: Diagnosis not present

## 2017-02-04 DIAGNOSIS — I10 Essential (primary) hypertension: Secondary | ICD-10-CM | POA: Diagnosis not present

## 2017-02-12 DIAGNOSIS — H1131 Conjunctival hemorrhage, right eye: Secondary | ICD-10-CM | POA: Diagnosis not present

## 2017-02-15 ENCOUNTER — Ambulatory Visit (INDEPENDENT_AMBULATORY_CARE_PROVIDER_SITE_OTHER): Payer: BLUE CROSS/BLUE SHIELD

## 2017-02-15 ENCOUNTER — Encounter (INDEPENDENT_AMBULATORY_CARE_PROVIDER_SITE_OTHER): Payer: Self-pay | Admitting: Orthopaedic Surgery

## 2017-02-15 ENCOUNTER — Ambulatory Visit (INDEPENDENT_AMBULATORY_CARE_PROVIDER_SITE_OTHER): Payer: BLUE CROSS/BLUE SHIELD | Admitting: Orthopaedic Surgery

## 2017-02-15 DIAGNOSIS — M25551 Pain in right hip: Secondary | ICD-10-CM | POA: Diagnosis not present

## 2017-02-15 DIAGNOSIS — M25552 Pain in left hip: Secondary | ICD-10-CM | POA: Diagnosis not present

## 2017-02-15 MED ORDER — METHYLPREDNISOLONE ACETATE 40 MG/ML IJ SUSP
40.0000 mg | INTRAMUSCULAR | Status: AC | PRN
  Administered 2017-02-15: 40 mg via INTRA_ARTICULAR

## 2017-02-15 MED ORDER — LIDOCAINE HCL 1 % IJ SOLN
3.0000 mL | INTRAMUSCULAR | Status: AC | PRN
  Administered 2017-02-15: 3 mL

## 2017-02-15 MED ORDER — BUPIVACAINE HCL 0.5 % IJ SOLN
3.0000 mL | INTRAMUSCULAR | Status: AC | PRN
  Administered 2017-02-15: 3 mL via INTRA_ARTICULAR

## 2017-02-15 NOTE — Progress Notes (Signed)
Office Visit Note   Patient: Selena Huff           Date of Birth: Oct 25, 1960           MRN: [id number] Visit Date: 02/15/2017              Requested by: Lucianne Lei, MD [address], [place] [address] PCP: Lucianne Lei, MD   Assessment & Plan: Visit Diagnoses:  1. Bilateral hip pain     Plan: Overall impression is left trochanteric bursitis and abductor tendinitis.  Cortisone injection was performed today. Patient has already failed physical therapy. I recommend that she not take her prednisone today or the next couple days to see her responses injection. If not better we will recommend MRI.  Follow-Up Instructions: Return if symptoms worsen or fail to improve.   Orders:  Orders Placed This Encounter  Procedures  . XR HIP UNILAT W OR W/O PELVIS 2-3 VIEWS LEFT  . XR HIP UNILAT W OR W/O PELVIS 2-3 VIEWS RIGHT   No orders of the defined types were placed in this encounter.     Procedures: Large Joint Inj Date/Time: 02/15/2017 9:18 AM Performed by: Leandrew Koyanagi Authorized by: Leandrew Koyanagi   Consent Given by:  Patient Timeout: prior to procedure the correct patient, procedure, and site was verified   Indications:  Pain Location:  Hip Site:  L greater trochanter Prep: patient was prepped and draped in usual sterile fashion   Needle Size:  22 G Approach:  Lateral Ultrasound Guidance: No   Fluoroscopic Guidance: No   Arthrogram: No   Medications:  3 mL lidocaine 1 %; 3 mL bupivacaine 0.5 %; 40 mg methylPREDNISolone acetate 40 MG/ML     Clinical Data: No additional findings.   Subjective: Chief Complaint  Patient presents with  . Right Hip - Pain  . Left Hip - Pain    Patient is a 57 year old female whose had bilateral hip pain worse on the left for years. Denies any injuries. She does endorse groin pain. The pain is worse with lying on her left side. She is on chronic prednisone which has improved the pain. Lyrica did not help. Denies any real  low back pain. Occasionally pain will radiate down her leg. Denies any numbness and tingling.    Review of Systems  Constitutional: Negative.   HENT: Negative.   Eyes: Negative.   Respiratory: Negative.   Cardiovascular: Negative.   Endocrine: Negative.   Musculoskeletal: Negative.   Neurological: Negative.   Hematological: Negative.   Psychiatric/Behavioral: Negative.   All other systems reviewed and are negative.    Objective: Vital Signs: There were no vitals taken for this visit.  Physical Exam  Constitutional: She is oriented to person, place, and time. She appears well-developed and well-nourished.  HENT:  Head: Normocephalic and atraumatic.  Eyes: EOM are normal.  Neck: Neck supple.  Pulmonary/Chest: Effort normal.  Abdominal: Soft.  Neurological: She is alert and oriented to person, place, and time.  Skin: Skin is warm. Capillary refill takes less than 2 seconds.  Psychiatric: She has a normal mood and affect. Her behavior is normal. Judgment and thought content normal.  Nursing note and vitals reviewed.   Ortho Exam Left hip exam shows good range of motion without any significant pain. Lateral hip is mildly tender. No sciatic tension signs. Mildly positive Faber test. Negative axial compression. Negative Stinchfield sign. Specialty Comments:  No specialty comments available.  Imaging: Xr  Hip Unilat W Or W/o Pelvis 2-3 Views Left  Result Date: 02/15/2017 No significant degenerative joint disease. Os acetabuli  Xr Hip Unilat W Or W/o Pelvis 2-3 Views Right  Result Date: 02/15/2017 No significant degenerative joint disease. Os acetabuli    PMFS History: There are no active problems to display for this patient.  Past Medical History:  Diagnosis Date  . Allergy     No family history on file.  No past surgical history on file. Social History   Occupational History  . Not on file.   Social History Main Topics  . Smoking status: Never Smoker  .  Smokeless tobacco: Never Used  . Alcohol use Not on file  . Drug use: Unknown  . Sexual activity: Not on file

## 2017-03-09 ENCOUNTER — Ambulatory Visit (INDEPENDENT_AMBULATORY_CARE_PROVIDER_SITE_OTHER): Payer: BLUE CROSS/BLUE SHIELD | Admitting: Orthopaedic Surgery

## 2017-03-09 DIAGNOSIS — M25551 Pain in right hip: Secondary | ICD-10-CM

## 2017-03-09 DIAGNOSIS — M25552 Pain in left hip: Secondary | ICD-10-CM | POA: Diagnosis not present

## 2017-03-09 NOTE — Progress Notes (Signed)
   Office Visit Note   Patient: Selena Huff           Date of Birth: 08/29/1960           MRN: [id number] Visit Date: 03/09/2017              Requested by: Lucianne Lei, MD [address] [place],  [id number] PCP: Lucianne Lei, MD   Assessment & Plan: Visit Diagnoses:  1. Bilateral hip pain     Plan: MRI of the lumbar spine to evaluate for structural abnormalities given signs and symptoms consistent with radiculopathy and failure of cortisone injection to provide full relief. Follow-up after the MRI.  Follow-Up Instructions: Return in about 2 weeks (around 03/23/2017).   Orders:  Orders Placed This Encounter  Procedures  . MR Lumbar Spine w/o contrast   No orders of the defined types were placed in this encounter.     Procedures: No procedures performed   Clinical Data: No additional findings.   Subjective: Chief Complaint  Patient presents with  . Right Hip - Pain, Follow-up  . Left Hip - Pain, Follow-up  . Left Leg - Pain  . Right Leg - Pain    Patient to 56 year old female who follows up today for bilateral hip and thigh pain. Her previous left trochanteric bursa injection helped tremendously. She mainly complains of just bilateral thigh pain with radiation down into the groin and into the lower leg. She takes Tylenol for the pain. Denies any new numbness and tingling.    Review of Systems   Objective: Vital Signs: There were no vitals taken for this visit.  Physical Exam  Ortho Exam Exam is stable. Lateral left hip is nontender. Specialty Comments:  No specialty comments available.  Imaging: No results found.   PMFS History: There are no active problems to display for this patient.  Past Medical History:  Diagnosis Date  . Allergy     No family history on file.  No past surgical history on file. Social History   Occupational History  . Not on file.   Social History Main Topics  . Smoking status: Never Smoker  . Smokeless  tobacco: Never Used  . Alcohol use Not on file  . Drug use: Unknown  . Sexual activity: Not on file

## 2017-03-29 ENCOUNTER — Ambulatory Visit (INDEPENDENT_AMBULATORY_CARE_PROVIDER_SITE_OTHER): Payer: BLUE CROSS/BLUE SHIELD | Admitting: Orthopaedic Surgery

## 2017-03-30 ENCOUNTER — Ambulatory Visit
Admission: RE | Admit: 2017-03-30 | Discharge: 2017-03-30 | Disposition: A | Discharge status: Home / Self Care | Payer: BLUE CROSS/BLUE SHIELD | Source: Ambulatory Visit | Attending: Orthopaedic Surgery | Admitting: Orthopaedic Surgery

## 2017-03-30 DIAGNOSIS — M25552 Pain in left hip: Principal | ICD-10-CM

## 2017-03-30 DIAGNOSIS — M25551 Pain in right hip: Secondary | ICD-10-CM

## 2017-03-30 DIAGNOSIS — M5126 Other intervertebral disc displacement, lumbar region: Secondary | ICD-10-CM | POA: Diagnosis not present

## 2017-04-01 ENCOUNTER — Encounter (INDEPENDENT_AMBULATORY_CARE_PROVIDER_SITE_OTHER): Payer: Self-pay | Admitting: Orthopaedic Surgery

## 2017-04-01 ENCOUNTER — Ambulatory Visit (INDEPENDENT_AMBULATORY_CARE_PROVIDER_SITE_OTHER): Payer: BLUE CROSS/BLUE SHIELD | Admitting: Orthopaedic Surgery

## 2017-04-01 DIAGNOSIS — M5416 Radiculopathy, lumbar region: Secondary | ICD-10-CM | POA: Insufficient documentation

## 2017-04-01 NOTE — Progress Notes (Signed)
   Office Visit Note   Patient: Selena Huff           Date of Birth: 04-26-1961           MRN: [id number] Visit Date: 04/01/2017              Requested by: Lucianne Lei, MD [address] [place], [place] [address] PCP: Lucianne Lei, MD   Assessment & Plan: Visit Diagnoses:  1. Lumbar radiculopathy     Plan: MRI is consistent with moderate spinal stenosis and subarticular stenosis at L4-L5. Referral for epidural steroid injection with Dr. Ernestina Patches. Questions encouraged and answered. Follow-up as needed.  Follow-Up Instructions: Return if symptoms worsen or fail to improve.   Orders:  No orders of the defined types were placed in this encounter.  No orders of the defined types were placed in this encounter.     Procedures: No procedures performed   Clinical Data: No additional findings.   Subjective: Chief Complaint  Patient presents with  . Right Hip - Pain, Follow-up  . Left Hip - Pain, Follow-up    Patient follows up today to review her MRI. She continues to have lumbar radicular symptoms. Denies any new changes.    Review of Systems   Objective: Vital Signs: There were no vitals taken for this visit.  Physical Exam  Ortho Exam Exam is stable Specialty Comments:  No specialty comments available.  Imaging: No results found.   PMFS History: Patient Active Problem List   Diagnosis Date Noted  . Lumbar radiculopathy 04/01/2017   Past Medical History:  Diagnosis Date  . Allergy     No family history on file.  No past surgical history on file. Social History   Occupational History  . Not on file.   Social History Main Topics  . Smoking status: Never Smoker  . Smokeless tobacco: Never Used  . Alcohol use Not on file  . Drug use: Unknown  . Sexual activity: Not on file

## 2017-04-01 NOTE — Addendum Note (Signed)
Addended by: Precious Bard on: 04/01/2017 11:22 AM   Modules accepted: Orders

## 2017-04-16 ENCOUNTER — Telehealth (INDEPENDENT_AMBULATORY_CARE_PROVIDER_SITE_OTHER): Payer: Self-pay | Admitting: Radiology

## 2017-04-16 DIAGNOSIS — I10 Essential (primary) hypertension: Secondary | ICD-10-CM | POA: Diagnosis not present

## 2017-04-16 DIAGNOSIS — E782 Mixed hyperlipidemia: Secondary | ICD-10-CM | POA: Diagnosis not present

## 2017-04-16 DIAGNOSIS — E088 Diabetes mellitus due to underlying condition with unspecified complications: Secondary | ICD-10-CM | POA: Diagnosis not present

## 2017-04-16 DIAGNOSIS — Z23 Encounter for immunization: Secondary | ICD-10-CM | POA: Diagnosis not present

## 2017-04-16 NOTE — Telephone Encounter (Signed)
Patient is calling stating she would like a sooner appt, due to pain in her legs.  She said anytime is fine with her.

## 2017-04-19 NOTE — Telephone Encounter (Signed)
Called patient to advise that we have nothing sooner than her appointment on 10/16. I have added her to the wait list and will call if we have cancellations.

## 2017-04-27 ENCOUNTER — Encounter (INDEPENDENT_AMBULATORY_CARE_PROVIDER_SITE_OTHER): Payer: Self-pay | Admitting: Physical Medicine and Rehabilitation

## 2017-04-27 ENCOUNTER — Ambulatory Visit (INDEPENDENT_AMBULATORY_CARE_PROVIDER_SITE_OTHER): Payer: BLUE CROSS/BLUE SHIELD

## 2017-04-27 ENCOUNTER — Ambulatory Visit (INDEPENDENT_AMBULATORY_CARE_PROVIDER_SITE_OTHER): Payer: BLUE CROSS/BLUE SHIELD | Admitting: Physical Medicine and Rehabilitation

## 2017-04-27 VITALS — BP 141/84 | HR 89 | Temp 98.1°F

## 2017-04-27 DIAGNOSIS — M5116 Intervertebral disc disorders with radiculopathy, lumbar region: Secondary | ICD-10-CM | POA: Insufficient documentation

## 2017-04-27 DIAGNOSIS — M48062 Spinal stenosis, lumbar region with neurogenic claudication: Secondary | ICD-10-CM | POA: Diagnosis not present

## 2017-04-27 DIAGNOSIS — M5416 Radiculopathy, lumbar region: Secondary | ICD-10-CM

## 2017-04-27 MED ORDER — LIDOCAINE HCL (PF) 1 % IJ SOLN
2.0000 mL | Freq: Once | INTRAMUSCULAR | Status: AC
  Administered 2017-04-27: 2 mL

## 2017-04-27 MED ORDER — BETAMETHASONE SOD PHOS & ACET 6 (3-3) MG/ML IJ SUSP
12.0000 mg | Freq: Once | INTRAMUSCULAR | Status: AC
  Administered 2017-04-27: 12 mg

## 2017-04-27 NOTE — Progress Notes (Deleted)
Lower back pain, right side pain, occasional left side pain. Radiating numbness and tingling. Constant pain, worse walking, standing, and sitting long periods of time.  Tylenol, not very helpful.  No contrast allergies. No bloodthinners. Has driver.

## 2017-04-27 NOTE — Patient Instructions (Signed)

## 2017-05-10 NOTE — Procedures (Signed)
Selena Huff is a 56 year old female with bilateral right more than left pain with referring pain and numbness and tingling down both legs.  She has been followed by Dr. Erlinda Hong and failed conservative care.  He did obtain an MRI which is reviewed below.  She basically has a combination of facet arthropathy and disc protrusion at L4-5 moderate central canal stenosis and moderate to severe foraminal stenosis.  We are going to complete a bilateral L4 transforaminal epidural steroid injection diagnostically and hopefully therapeutically.  The injection  will be diagnostic and hopefully therapeutic. The patient has failed conservative care including time, medications and activity modification.  Lumbosacral Transforaminal Epidural Steroid Injection - Sub-Pedicular Approach with Fluoroscopic Guidance  Patient: Selena Huff      Date of Birth: [birth year] MRN: [id number] PCP: Lucianne Lei, MD      Visit Date: 04/27/2017   Universal Protocol:    Date/Time: 04/27/2017  Consent Given By: the patient  Position: PRONE  Additional Comments: Vital signs were monitored before and after the procedure. Patient was prepped and draped in the usual sterile fashion. The correct patient, procedure, and site was verified.   Injection Procedure Details:  Procedure Site One Meds Administered:  Meds ordered this encounter  Medications  . lidocaine (PF) (XYLOCAINE) 1 % injection 2 mL  . betamethasone acetate-betamethasone sodium phosphate (CELESTONE) injection 12 mg    Laterality: Bilateral  Location/Site:  L4-L5  Needle size: 22 G  Needle type: Spinal  Needle Placement: Transforaminal  Findings:  -Contrast Used: 0.5 mL iohexol 180 mg iodine/mL   -Comments: Excellent flow of contrast along the nerve and into the epidural space.  Procedure Details: After squaring off the end-plates to get a true AP view, the C-arm was positioned so that an oblique view of the foramen as noted above was visualized. The  target area is just inferior to the "nose of the scotty dog" or sub pedicular. The soft tissues overlying this structure were infiltrated with 2-3 ml. of 1% Lidocaine without Epinephrine.  The spinal needle was inserted toward the target using a "trajectory" view along the fluoroscope beam.  Under AP and lateral visualization, the needle was advanced so it did not puncture dura and was located close the 6 O'Clock position of the pedical in AP tracterory. Biplanar projections were used to confirm position. Aspiration was confirmed to be negative for CSF and/or blood. A 1-2 ml. volume of Isovue-250 was injected and flow of contrast was noted at each level. Radiographs were obtained for documentation purposes.   After attaining the desired flow of contrast documented above, a 0.5 to 1.0 ml test dose of 0.25% Marcaine was injected into each respective transforaminal space.  The patient was observed for 90 seconds post injection.  After no sensory deficits were reported, and normal lower extremity motor function was noted,   the above injectate was administered so that equal amounts of the injectate were placed at each foramen (level) into the transforaminal epidural space.   Additional Comments:  The patient tolerated the procedure well Dressing: Band-Aid    Post-procedure details: Patient was observed during the procedure. Post-procedure instructions were reviewed.  Patient left the clinic in stable condition.

## 2017-05-20 ENCOUNTER — Telehealth (INDEPENDENT_AMBULATORY_CARE_PROVIDER_SITE_OTHER): Payer: Self-pay | Admitting: Physical Medicine and Rehabilitation

## 2017-05-20 NOTE — Telephone Encounter (Signed)
Scheduled for 11/15 at 1615.

## 2017-05-20 NOTE — Telephone Encounter (Signed)
Bilat L5  TF vs L4-5 interlam

## 2017-05-27 ENCOUNTER — Encounter (INDEPENDENT_AMBULATORY_CARE_PROVIDER_SITE_OTHER): Payer: Self-pay | Admitting: Physical Medicine and Rehabilitation

## 2017-05-27 ENCOUNTER — Ambulatory Visit (INDEPENDENT_AMBULATORY_CARE_PROVIDER_SITE_OTHER): Payer: BLUE CROSS/BLUE SHIELD | Admitting: Physical Medicine and Rehabilitation

## 2017-05-27 ENCOUNTER — Ambulatory Visit (INDEPENDENT_AMBULATORY_CARE_PROVIDER_SITE_OTHER): Payer: Self-pay

## 2017-05-27 VITALS — BP 137/89 | HR 84 | Temp 98.3°F

## 2017-05-27 DIAGNOSIS — G039 Meningitis, unspecified: Secondary | ICD-10-CM

## 2017-05-27 DIAGNOSIS — M5116 Intervertebral disc disorders with radiculopathy, lumbar region: Secondary | ICD-10-CM

## 2017-05-27 DIAGNOSIS — M5416 Radiculopathy, lumbar region: Secondary | ICD-10-CM | POA: Diagnosis not present

## 2017-05-27 DIAGNOSIS — M48062 Spinal stenosis, lumbar region with neurogenic claudication: Secondary | ICD-10-CM

## 2017-05-27 MED ORDER — BETAMETHASONE SOD PHOS & ACET 6 (3-3) MG/ML IJ SUSP
12.0000 mg | Freq: Once | INTRAMUSCULAR | Status: AC
  Administered 2017-05-27: 12 mg

## 2017-05-27 MED ORDER — LIDOCAINE HCL (PF) 1 % IJ SOLN
2.0000 mL | Freq: Once | INTRAMUSCULAR | Status: AC
  Administered 2017-05-27: 2 mL

## 2017-05-27 NOTE — Patient Instructions (Signed)

## 2017-05-27 NOTE — Progress Notes (Deleted)
Lower back pain. Right and left side pain. Radiating pain and burning sensation to bilateral legs. States last injection was about 1 month ago. Pain has returned the past 2 weeks.  Constant pain, sitting and walking long periods of time increases pain.  Has driver. No contrast allergy. No blood thinner.  Got to have something done tired of pain, will try one more

## 2017-05-29 DIAGNOSIS — Z1231 Encounter for screening mammogram for malignant neoplasm of breast: Secondary | ICD-10-CM | POA: Diagnosis not present

## 2017-06-07 NOTE — Procedures (Signed)
Selena Huff is a 56 year old female who is recently been followed by Dr. Erlinda Hong.  We completed a bilateral L4 transforaminal injection about 1 month ago obtained about 2 weeks of really good relief of symptoms.  She reports radiating pain and burning sensation to the bilateral legs.  She does report low back pain.  MRI of the lumbar spine shows significant but moderate stenosis at L4-5 with facet arthropathy.  There is lateral recess narrowing as well.  She has a transitional sacralized L5 segment.  She also has clumping of nerve roots suggestive of history of arachnoiditis.  Unfortunately if the injection is really not helpful she really does need to see the spine surgeons or neurosurgeons for evaluation.  She has no signs of infection.  Injection did seem to help temporarily.  Repeating the injection one time is worthwhile.  We are going to do this today.  Lumbosacral Transforaminal Epidural Steroid Injection - Infraneural Approach with Fluoroscopic Guidance  Patient: Selena Huff      Date of Birth: 1961-01-28 MRN: [id number] PCP: Lucianne Lei, MD      Visit Date: 05/27/2017   Universal Protocol:     Consent Given By: the patient  Position: PRONE   Additional Comments: Vital signs were monitored before and after the procedure. Patient was prepped and draped in the usual sterile fashion. The correct patient, procedure, and site was verified.   Injection Procedure Details:  Procedure Site One Meds Administered:  Meds ordered this encounter  Medications  . lidocaine (PF) (XYLOCAINE) 1 % injection 2 mL  . betamethasone acetate-betamethasone sodium phosphate (CELESTONE) injection 12 mg      Laterality: Bilateral  Location/Site:  L4-L5  Needle size: 22 G  Needle type: Spinal  Needle Placement: Transforaminal  Findings:  -Contrast Used: 1 mL iohexol 180 mg iodine/mL   -Comments: Excellent flow of contrast along the nerve and into the epidural space.  Procedure Details: After  squaring off the end-plates of the desired vertebral level to get a true AP view, the C-arm was obliqued to the painful side so that the superior articulating process is positioned about 1/3 the length of the inferior endplate.  The needle was aimed toward the junction of the superior articular process and the transverse process of the inferior vertebrae. The needle's initial entry is in the lower third of the foramen through Kambin's triangle. The soft tissues overlying this target were infiltrated with 2-3 ml. of 1% Lidocaine without Epinephrine.  The spinal needle was then inserted and advanced toward the target using a "trajectory" view along the fluoroscope beam.  Under AP and lateral visualization, the needle was advanced so it did not puncture dura and did not traverse medially beyond the 6 o'clock position of the pedicle. Bi-planar projections were used to confirm position. Aspiration was confirmed to be negative for CSF and/or blood. A 1-2 ml. volume of Isovue-250 was injected and flow of contrast was noted at each level. Radiographs were obtained for documentation purposes.   After attaining the desired flow of contrast documented above, a 0.5 to 1.0 ml test dose of 0.25% Marcaine was injected into each respective transforaminal space.  The patient was observed for 90 seconds post injection.  After no sensory deficits were reported, and normal lower extremity motor function was noted,   the above injectate was administered so that equal amounts of the injectate were placed at each foramen (level) into the transforaminal epidural space.   Additional Comments:  The patient tolerated the procedure  well Dressing: Band-Aid    Post-procedure details: Patient was observed during the procedure. Post-procedure instructions were reviewed.  Patient left the clinic in stable condition.

## 2017-06-10 ENCOUNTER — Telehealth (INDEPENDENT_AMBULATORY_CARE_PROVIDER_SITE_OTHER): Payer: Self-pay | Admitting: Physical Medicine and Rehabilitation

## 2017-06-10 NOTE — Telephone Encounter (Signed)
She is likely going to need referral to spine/nsgy, may want Dr. Erlinda Hong to weigh in.

## 2017-06-10 NOTE — Telephone Encounter (Signed)
Dr. Consuella Lose neurosurgery

## 2017-06-10 NOTE — Telephone Encounter (Signed)
See message below. Please advise on what to do next.

## 2017-06-10 NOTE — Telephone Encounter (Signed)
Please advise on this one. Injections have not helped with leg pain. See messages below.

## 2017-06-11 ENCOUNTER — Other Ambulatory Visit (INDEPENDENT_AMBULATORY_CARE_PROVIDER_SITE_OTHER): Payer: Self-pay

## 2017-06-11 DIAGNOSIS — M5416 Radiculopathy, lumbar region: Secondary | ICD-10-CM

## 2017-06-11 NOTE — Telephone Encounter (Signed)
Referral made. They will call her to make appt

## 2017-06-14 DIAGNOSIS — D649 Anemia, unspecified: Secondary | ICD-10-CM | POA: Diagnosis not present

## 2017-06-14 DIAGNOSIS — E119 Type 2 diabetes mellitus without complications: Secondary | ICD-10-CM | POA: Diagnosis not present

## 2017-06-14 DIAGNOSIS — D500 Iron deficiency anemia secondary to blood loss (chronic): Secondary | ICD-10-CM | POA: Diagnosis not present

## 2017-06-14 DIAGNOSIS — M130 Polyarthritis, unspecified: Secondary | ICD-10-CM | POA: Diagnosis not present

## 2017-06-15 ENCOUNTER — Other Ambulatory Visit: Payer: Self-pay | Admitting: Family Medicine

## 2017-06-15 ENCOUNTER — Other Ambulatory Visit (HOSPITAL_COMMUNITY)
Admission: RE | Admit: 2017-06-15 | Discharge: 2017-06-15 | Disposition: A | Discharge status: Home / Self Care | Payer: BLUE CROSS/BLUE SHIELD | Source: Ambulatory Visit | Attending: Family Medicine | Admitting: Family Medicine

## 2017-06-15 DIAGNOSIS — Z01419 Encounter for gynecological examination (general) (routine) without abnormal findings: Secondary | ICD-10-CM | POA: Diagnosis not present

## 2017-06-15 DIAGNOSIS — Z0000 Encounter for general adult medical examination without abnormal findings: Secondary | ICD-10-CM | POA: Diagnosis not present

## 2017-06-15 DIAGNOSIS — E118 Type 2 diabetes mellitus with unspecified complications: Secondary | ICD-10-CM | POA: Diagnosis not present

## 2017-06-15 DIAGNOSIS — I10 Essential (primary) hypertension: Secondary | ICD-10-CM | POA: Diagnosis not present

## 2017-06-17 LAB — CYTOLOGY - PAP: Diagnosis: NEGATIVE

## 2017-06-23 DIAGNOSIS — Z6838 Body mass index (BMI) 38.0-38.9, adult: Secondary | ICD-10-CM | POA: Diagnosis not present

## 2017-06-23 DIAGNOSIS — I10 Essential (primary) hypertension: Secondary | ICD-10-CM | POA: Diagnosis not present

## 2017-06-23 DIAGNOSIS — M48061 Spinal stenosis, lumbar region without neurogenic claudication: Secondary | ICD-10-CM | POA: Diagnosis not present

## 2017-07-02 DIAGNOSIS — M5126 Other intervertebral disc displacement, lumbar region: Secondary | ICD-10-CM | POA: Diagnosis not present

## 2017-08-04 DIAGNOSIS — J069 Acute upper respiratory infection, unspecified: Secondary | ICD-10-CM | POA: Diagnosis not present

## 2017-08-04 DIAGNOSIS — J028 Acute pharyngitis due to other specified organisms: Secondary | ICD-10-CM | POA: Diagnosis not present

## 2017-08-04 DIAGNOSIS — J399 Disease of upper respiratory tract, unspecified: Secondary | ICD-10-CM | POA: Diagnosis not present

## 2017-08-04 DIAGNOSIS — I10 Essential (primary) hypertension: Secondary | ICD-10-CM | POA: Diagnosis not present

## 2017-08-28 IMAGING — RF DG ESOPHAGUS
8 of 10 series · 19 of 24 positions shown · non-contrast
Comparison: None.

CLINICAL DATA: Dysphagia.

EXAM:
ESOPHOGRAM / BARIUM SWALLOW / BARIUM TABLET STUDY
TECHNIQUE: Combined double contrast and single contrast examination performed
using effervescent crystals, thick barium liquid, and thin barium
liquid. The patient was observed with fluoroscopy swallowing a 13 mm
barium sulphate tablet.
FLUOROSCOPY TIME:  Radiation Exposure Index (as provided by the
fluoroscopic device):
If the device does not provide the exposure index:
Fluoroscopy Time:  1 minutes 0 second
Number of Acquired Images:

[Series 1: run · 12 of 22 slices shown (1 of 8)]
[im 1/22]
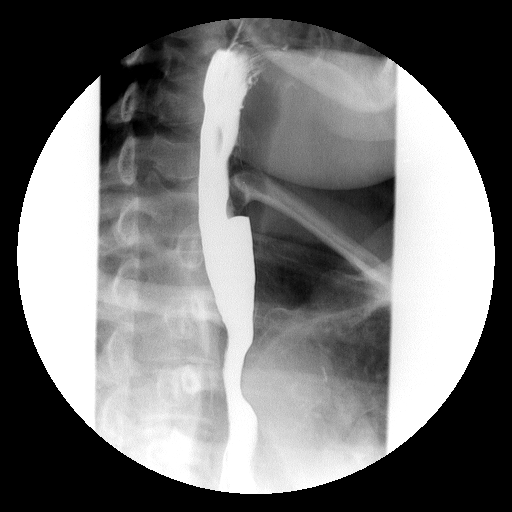
[im 2/22]
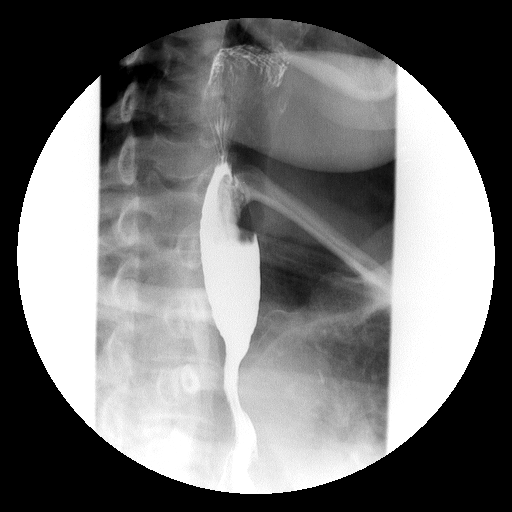
[im 5/22]
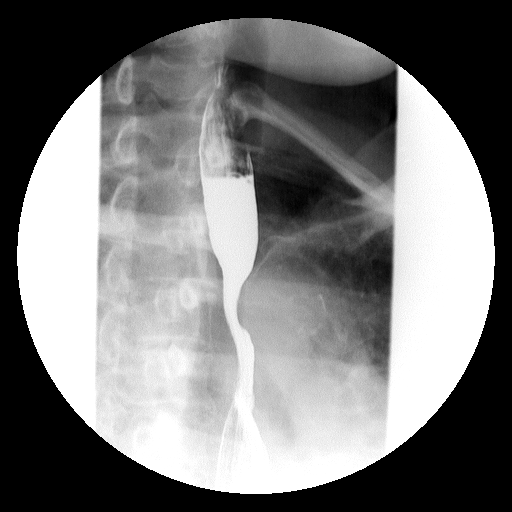
[im 7/22]
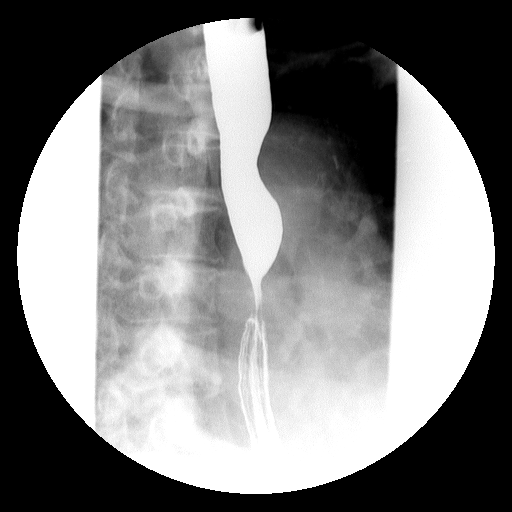
[im 8/22]
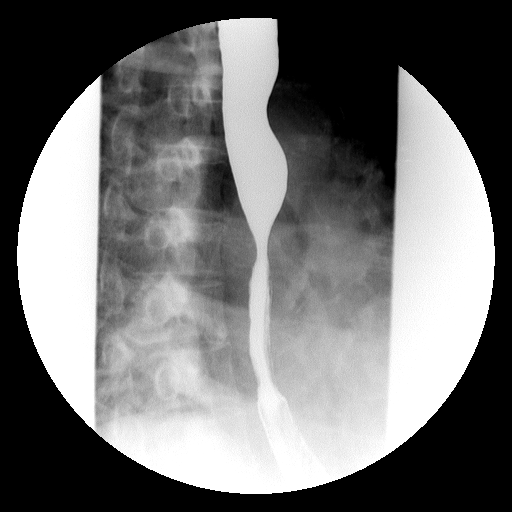
[im 10/22]
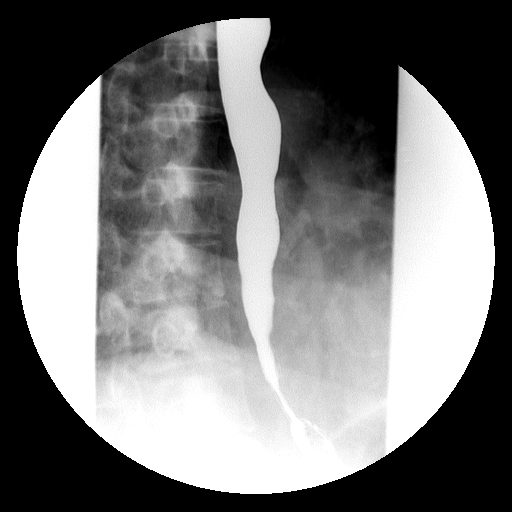
[im 13/22]
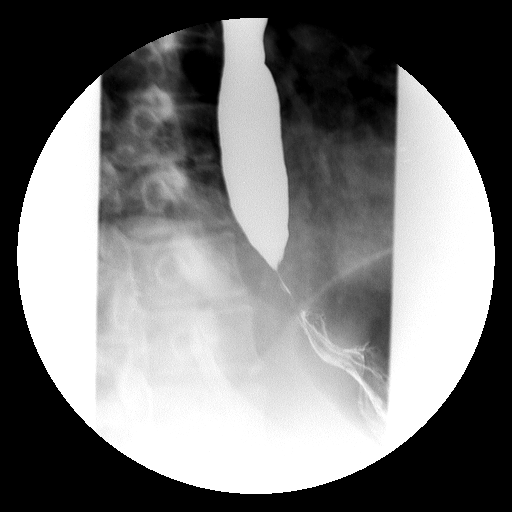
[im 14/22]
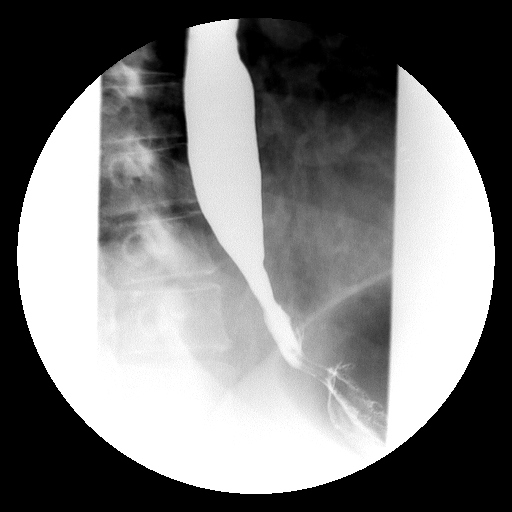
[im 16/22]
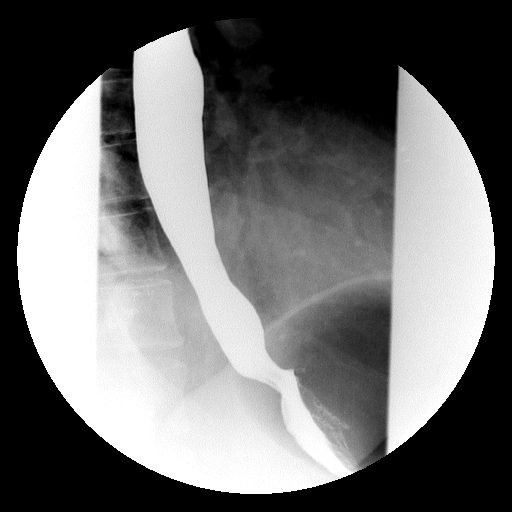
[im 19/22]
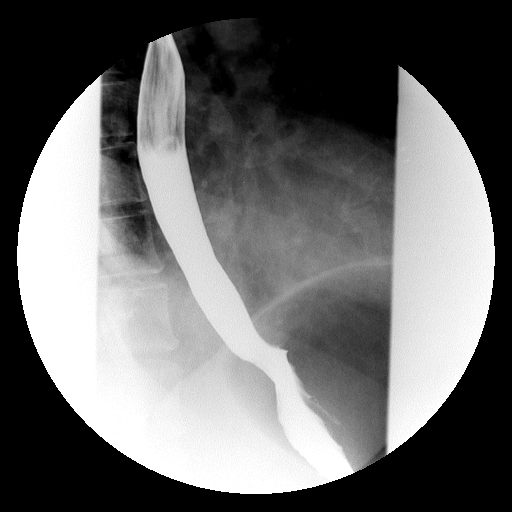
[im 20/22]
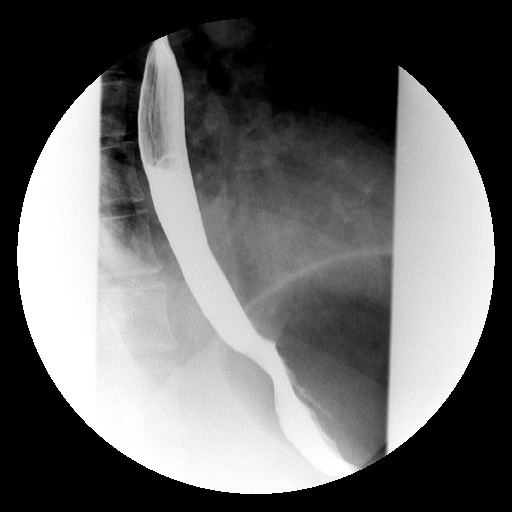
[im 22/22]
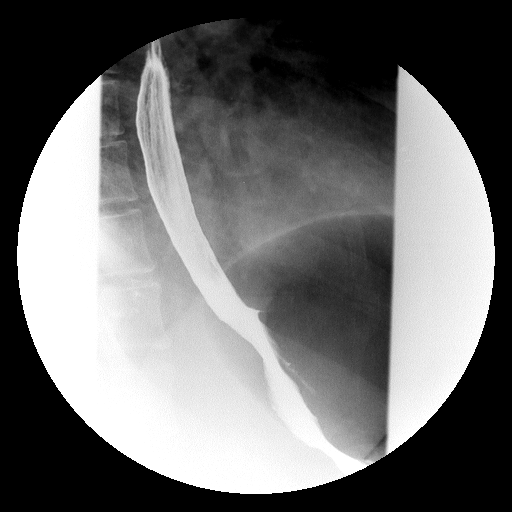

[Series 2: run · 1 of 1 slices shown (2 of 8)]
[im 1/1]
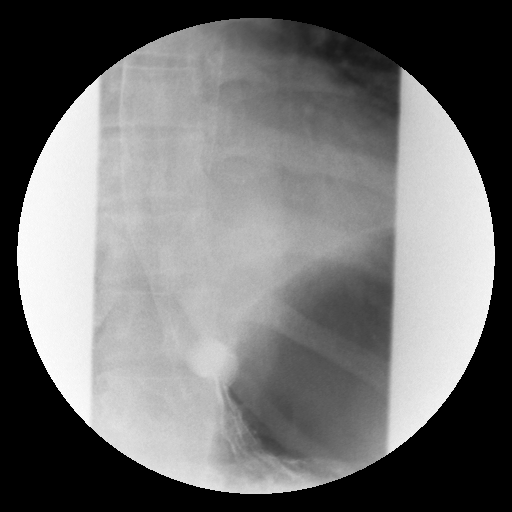

[Series 4: run · 1 of 1 slices shown (3 of 8)]
[im 1/1]
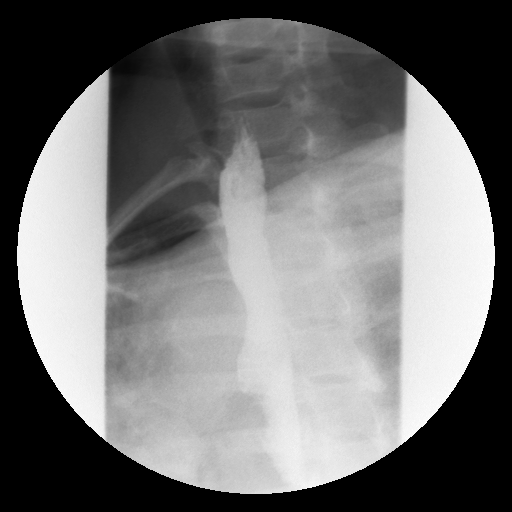

[Series 5: run · 1 of 1 slices shown (4 of 8)]
[im 1/1]
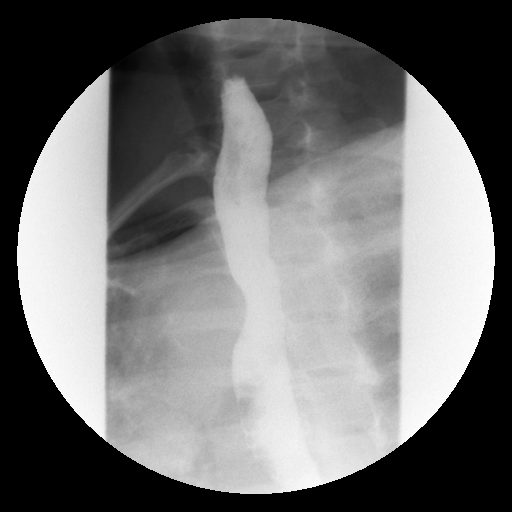

[Series 6: run · 1 of 1 slices shown (5 of 8)]
[im 1/1]
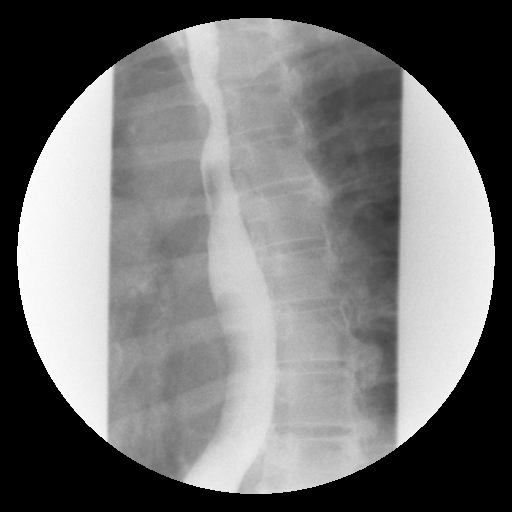

[Series 7: run · 1 of 1 slices shown (6 of 8)]
[im 1/1]
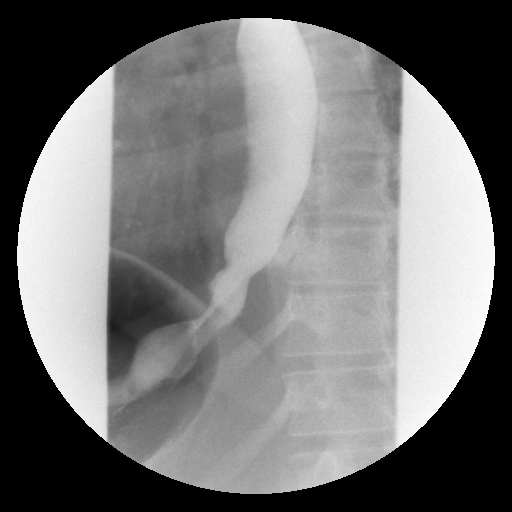

[Series 9: run · 1 of 1 slices shown (7 of 8)]
[im 1/1]
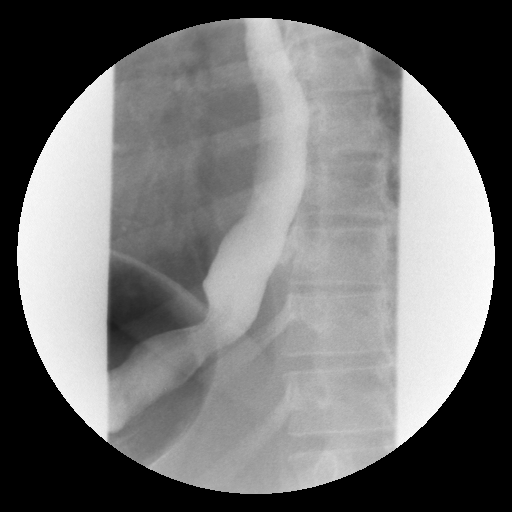

[Series 10: run · 1 of 1 slices shown (8 of 8)]
[im 1/1]
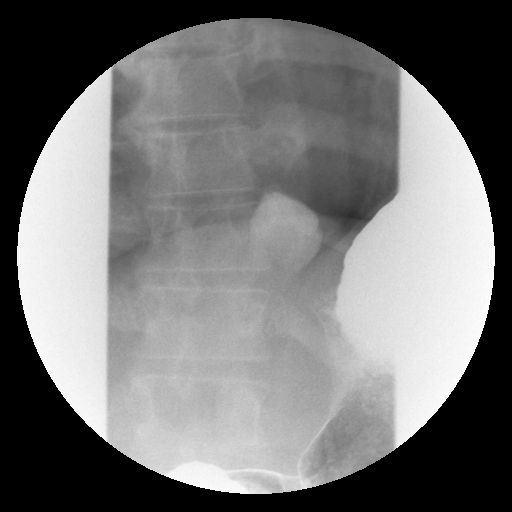

[19 of 24 positions shown; findings below may reference images not displayed]

FINDINGS: Esophageal mucosa and motility normal.  No stricture or mass lesion.

Barium tablet passed readily into the stomach without delay

Negative for hiatal hernia. Slight gastroesophageal reflux with
water siphon technique
IMPRESSION: Negative for hiatal hernia or stricture. Slight gastroesophageal
reflux.

## 2017-10-11 DIAGNOSIS — E785 Hyperlipidemia, unspecified: Secondary | ICD-10-CM | POA: Diagnosis not present

## 2017-10-11 DIAGNOSIS — I10 Essential (primary) hypertension: Secondary | ICD-10-CM | POA: Diagnosis not present

## 2017-10-11 DIAGNOSIS — E119 Type 2 diabetes mellitus without complications: Secondary | ICD-10-CM | POA: Diagnosis not present

## 2017-10-11 DIAGNOSIS — D649 Anemia, unspecified: Secondary | ICD-10-CM | POA: Diagnosis not present

## 2017-10-13 DIAGNOSIS — M545 Low back pain: Secondary | ICD-10-CM | POA: Diagnosis not present

## 2017-10-13 DIAGNOSIS — I10 Essential (primary) hypertension: Secondary | ICD-10-CM | POA: Diagnosis not present

## 2017-11-10 DIAGNOSIS — E118 Type 2 diabetes mellitus with unspecified complications: Secondary | ICD-10-CM | POA: Diagnosis not present

## 2017-11-10 DIAGNOSIS — M130 Polyarthritis, unspecified: Secondary | ICD-10-CM | POA: Diagnosis not present

## 2017-11-10 DIAGNOSIS — E088 Diabetes mellitus due to underlying condition with unspecified complications: Secondary | ICD-10-CM | POA: Diagnosis not present

## 2017-11-22 DIAGNOSIS — I10 Essential (primary) hypertension: Secondary | ICD-10-CM | POA: Diagnosis not present

## 2017-11-22 DIAGNOSIS — M545 Low back pain: Secondary | ICD-10-CM | POA: Diagnosis not present

## 2017-11-22 DIAGNOSIS — Z6837 Body mass index (BMI) 37.0-37.9, adult: Secondary | ICD-10-CM | POA: Diagnosis not present

## 2018-03-09 DIAGNOSIS — D649 Anemia, unspecified: Secondary | ICD-10-CM | POA: Diagnosis not present

## 2018-03-09 DIAGNOSIS — E1165 Type 2 diabetes mellitus with hyperglycemia: Secondary | ICD-10-CM | POA: Diagnosis not present

## 2018-03-09 DIAGNOSIS — I10 Essential (primary) hypertension: Secondary | ICD-10-CM | POA: Diagnosis not present

## 2018-03-09 DIAGNOSIS — E785 Hyperlipidemia, unspecified: Secondary | ICD-10-CM | POA: Diagnosis not present

## 2018-03-10 DIAGNOSIS — E782 Mixed hyperlipidemia: Secondary | ICD-10-CM | POA: Diagnosis not present

## 2018-03-10 DIAGNOSIS — J201 Acute bronchitis due to Hemophilus influenzae: Secondary | ICD-10-CM | POA: Diagnosis not present

## 2018-03-10 DIAGNOSIS — E119 Type 2 diabetes mellitus without complications: Secondary | ICD-10-CM | POA: Diagnosis not present

## 2018-03-10 DIAGNOSIS — I10 Essential (primary) hypertension: Secondary | ICD-10-CM | POA: Diagnosis not present

## 2018-03-21 DIAGNOSIS — J301 Allergic rhinitis due to pollen: Secondary | ICD-10-CM | POA: Diagnosis not present

## 2018-05-28 DIAGNOSIS — Z1231 Encounter for screening mammogram for malignant neoplasm of breast: Secondary | ICD-10-CM | POA: Diagnosis not present

## 2018-06-18 DIAGNOSIS — E785 Hyperlipidemia, unspecified: Secondary | ICD-10-CM | POA: Diagnosis not present

## 2018-06-18 DIAGNOSIS — E1165 Type 2 diabetes mellitus with hyperglycemia: Secondary | ICD-10-CM | POA: Diagnosis not present

## 2018-06-18 DIAGNOSIS — I10 Essential (primary) hypertension: Secondary | ICD-10-CM | POA: Diagnosis not present

## 2018-06-18 DIAGNOSIS — E119 Type 2 diabetes mellitus without complications: Secondary | ICD-10-CM | POA: Diagnosis not present

## 2018-06-21 DIAGNOSIS — I10 Essential (primary) hypertension: Secondary | ICD-10-CM | POA: Diagnosis not present

## 2018-06-21 DIAGNOSIS — E785 Hyperlipidemia, unspecified: Secondary | ICD-10-CM | POA: Diagnosis not present

## 2018-06-21 DIAGNOSIS — Z0000 Encounter for general adult medical examination without abnormal findings: Secondary | ICD-10-CM | POA: Diagnosis not present

## 2018-11-05 DIAGNOSIS — E785 Hyperlipidemia, unspecified: Secondary | ICD-10-CM | POA: Diagnosis not present

## 2018-11-05 DIAGNOSIS — I10 Essential (primary) hypertension: Secondary | ICD-10-CM | POA: Diagnosis not present

## 2018-11-05 DIAGNOSIS — E1165 Type 2 diabetes mellitus with hyperglycemia: Secondary | ICD-10-CM | POA: Diagnosis not present

## 2018-11-05 DIAGNOSIS — E119 Type 2 diabetes mellitus without complications: Secondary | ICD-10-CM | POA: Diagnosis not present

## 2018-11-07 DIAGNOSIS — H6011 Cellulitis of right external ear: Secondary | ICD-10-CM | POA: Diagnosis not present

## 2018-11-07 DIAGNOSIS — I10 Essential (primary) hypertension: Secondary | ICD-10-CM | POA: Diagnosis not present

## 2018-11-07 DIAGNOSIS — E782 Mixed hyperlipidemia: Secondary | ICD-10-CM | POA: Diagnosis not present

## 2018-11-29 DIAGNOSIS — I10 Essential (primary) hypertension: Secondary | ICD-10-CM | POA: Diagnosis not present

## 2018-11-29 DIAGNOSIS — J301 Allergic rhinitis due to pollen: Secondary | ICD-10-CM | POA: Diagnosis not present

## 2018-11-29 DIAGNOSIS — E1165 Type 2 diabetes mellitus with hyperglycemia: Secondary | ICD-10-CM | POA: Diagnosis not present

## 2018-11-29 DIAGNOSIS — E785 Hyperlipidemia, unspecified: Secondary | ICD-10-CM | POA: Diagnosis not present

## 2019-03-02 DIAGNOSIS — I10 Essential (primary) hypertension: Secondary | ICD-10-CM | POA: Diagnosis not present

## 2019-03-02 DIAGNOSIS — E782 Mixed hyperlipidemia: Secondary | ICD-10-CM | POA: Diagnosis not present

## 2019-03-02 DIAGNOSIS — J300 Vasomotor rhinitis: Secondary | ICD-10-CM | POA: Diagnosis not present

## 2019-03-02 DIAGNOSIS — E1169 Type 2 diabetes mellitus with other specified complication: Secondary | ICD-10-CM | POA: Diagnosis not present

## 2019-03-06 DIAGNOSIS — E1100 Type 2 diabetes mellitus with hyperosmolarity without nonketotic hyperglycemic-hyperosmolar coma (NKHHC): Secondary | ICD-10-CM | POA: Diagnosis not present

## 2019-03-06 DIAGNOSIS — D630 Anemia in neoplastic disease: Secondary | ICD-10-CM | POA: Diagnosis not present

## 2019-03-06 DIAGNOSIS — E1169 Type 2 diabetes mellitus with other specified complication: Secondary | ICD-10-CM | POA: Diagnosis not present

## 2019-03-06 DIAGNOSIS — I110 Hypertensive heart disease with heart failure: Secondary | ICD-10-CM | POA: Diagnosis not present

## 2019-03-06 DIAGNOSIS — D638 Anemia in other chronic diseases classified elsewhere: Secondary | ICD-10-CM | POA: Diagnosis not present

## 2019-03-18 DIAGNOSIS — Z20828 Contact with and (suspected) exposure to other viral communicable diseases: Secondary | ICD-10-CM | POA: Diagnosis not present

## 2019-03-29 ENCOUNTER — Other Ambulatory Visit: Payer: Self-pay

## 2019-03-29 DIAGNOSIS — R6889 Other general symptoms and signs: Secondary | ICD-10-CM | POA: Diagnosis not present

## 2019-03-29 DIAGNOSIS — Z20822 Contact with and (suspected) exposure to covid-19: Secondary | ICD-10-CM

## 2019-03-30 LAB — NOVEL CORONAVIRUS, NAA: SARS-CoV-2, NAA: DETECTED — AB

## 2019-04-03 DIAGNOSIS — Z20828 Contact with and (suspected) exposure to other viral communicable diseases: Secondary | ICD-10-CM | POA: Diagnosis not present

## 2019-04-05 DIAGNOSIS — E1169 Type 2 diabetes mellitus with other specified complication: Secondary | ICD-10-CM | POA: Diagnosis not present

## 2019-04-05 DIAGNOSIS — I10 Essential (primary) hypertension: Secondary | ICD-10-CM | POA: Diagnosis not present

## 2019-04-05 DIAGNOSIS — D509 Iron deficiency anemia, unspecified: Secondary | ICD-10-CM | POA: Diagnosis not present

## 2019-05-02 DIAGNOSIS — R300 Dysuria: Secondary | ICD-10-CM | POA: Diagnosis not present

## 2019-06-17 DIAGNOSIS — Z1231 Encounter for screening mammogram for malignant neoplasm of breast: Secondary | ICD-10-CM | POA: Diagnosis not present

## 2019-06-26 DIAGNOSIS — E1165 Type 2 diabetes mellitus with hyperglycemia: Secondary | ICD-10-CM | POA: Diagnosis not present

## 2019-06-26 DIAGNOSIS — I10 Essential (primary) hypertension: Secondary | ICD-10-CM | POA: Diagnosis not present

## 2019-06-26 DIAGNOSIS — E785 Hyperlipidemia, unspecified: Secondary | ICD-10-CM | POA: Diagnosis not present

## 2019-06-26 DIAGNOSIS — E1169 Type 2 diabetes mellitus with other specified complication: Secondary | ICD-10-CM | POA: Diagnosis not present

## 2019-06-27 DIAGNOSIS — Z0000 Encounter for general adult medical examination without abnormal findings: Secondary | ICD-10-CM | POA: Diagnosis not present

## 2019-07-04 ENCOUNTER — Telehealth: Payer: Self-pay | Admitting: Hematology and Oncology

## 2019-07-04 NOTE — Telephone Encounter (Signed)
Received a new hem referral from Dr. Criss Rosales for dx of IDA. Pt returned my call and has been scheduled to see Dr. Lindi Adie on 1/7 345pm. Pt aware to arrive 15 minutes early.

## 2019-07-11 ENCOUNTER — Telehealth: Payer: Self-pay | Admitting: Hematology and Oncology

## 2019-07-11 NOTE — Telephone Encounter (Signed)
I cld MS. Losurdo to r/s her new pt appt w/Dr. Lindi Adie from 1/7. I lft a vm for the pt to return my call

## 2019-07-17 ENCOUNTER — Telehealth: Payer: Self-pay | Admitting: Hematology and Oncology

## 2019-07-17 NOTE — Telephone Encounter (Signed)
Pt returned my call and has been rescheduled to see Dr. Lindi Adie on 1/18 at 345pm. Per pt, she needed the latest appt in the day.

## 2019-07-20 ENCOUNTER — Encounter: Payer: BC Managed Care – PPO | Admitting: Hematology and Oncology

## 2019-07-30 NOTE — Progress Notes (Signed)
[place] NOTE  Patient Care Team: Lucianne Lei, MD as PCP - General (Family Medicine)  CHIEF COMPLAINTS/PURPOSE OF CONSULTATION:  Newly diagnosed anemia  HISTORY OF PRESENTING ILLNESS:  Selena Huff 59 y.o. female is here because of recent diagnosis of iron deficiency anemia. She is referred by her PCP, Dr. Lucianne Lei. Labs on 06/26/19 showed: Hg 11.5, HCT 34.1, MCV 87.9, platelets 201. She presents to the clinic today for initial evaluation and discussion of treatment options.  She was seen by Dr. Alen Blew in 2016 for iron deficiency related to heavy menstrual cycles.  This is subsided and her hemoglobin had normalized.  Recently she was noted to have a hemoglobin of 11.5.  Because she was complaining of fatigue related symptoms she was referred back to [place] for further evaluation.  She works in [place] and sometimes she is extremely exhausted and she does not think it is related to the severity of her work.  She is also overweight and has high cholesterol levels.  I reviewed her records extensively and collaborated the history with the patient.  MEDICAL HISTORY:  Past Medical History:  Diagnosis Date  . Allergy   Hypercholesterolemia  SURGICAL HISTORY: No past surgical history on file.  SOCIAL HISTORY: Social History   Socioeconomic History  . Marital status: Single    Spouse name: Not on file  . Number of children: Not on file  . Years of education: Not on file  . Highest education level: Not on file  Occupational History  . Not on file  Tobacco Use  . Smoking status: Never Smoker  . Smokeless tobacco: Never Used  Substance and Sexual Activity  . Alcohol use: Not on file  . Drug use: Not on file  . Sexual activity: Not on file  Other Topics Concern  . Not on file  Social History Narrative  . Not on file   Social Determinants of Health   Financial Resource Strain:   . Difficulty of Paying Living Expenses: Not on file  Food Insecurity:    . Worried About Charity fundraiser in the Last Year: Not on file  . Ran Out of Food in the Last Year: Not on file  Transportation Needs:   . Lack of Transportation (Medical): Not on file  . Lack of Transportation (Non-Medical): Not on file  Physical Activity:   . Days of Exercise per Week: Not on file  . Minutes of Exercise per Session: Not on file  Stress:   . Feeling of Stress : Not on file  Social Connections:   . Frequency of Communication with Friends and Family: Not on file  . Frequency of Social Gatherings with Friends and Family: Not on file  . Attends Religious Services: Not on file  . Active Member of Clubs or Organizations: Not on file  . Attends Archivist Meetings: Not on file  . Marital Status: Not on file  Intimate Partner Violence:   . Fear of Current or Ex-Partner: Not on file  . Emotionally Abused: Not on file  . Physically Abused: Not on file  . Sexually Abused: Not on file    FAMILY HISTORY: No family history on file.  ALLERGIES:  is allergic to voltaren [diclofenac sodium].  MEDICATIONS:  Current Outpatient Medications  Medication Sig Dispense Refill  . ascorbic acid (VITAMIN C) 1000 MG tablet Take 1,000 mg by mouth daily.    . fluticasone (FLONASE) 50 MCG/ACT nasal spray 1 spray. One spray in  each nostril twice a day    . Iron-FA-B Cmp-C-Biot-Probiotic (FUSION PLUS) CAPS Take 1 capsule by mouth daily.    Marland Kitchen losartan (COZAAR) 50 MG tablet Take 50 mg by mouth daily.    Marland Kitchen losartan-hydrochlorothiazide (HYZAAR) 50-12.5 MG tablet Take by mouth.    . meloxicam (MOBIC) 15 MG tablet Take 15 mg by mouth daily.    . metFORMIN (GLUCOPHAGE) 500 MG tablet Take 2 tablets by mouth daily.    . montelukast (SINGULAIR) 10 MG tablet Take 10 mg by mouth daily.    . predniSONE (DELTASONE) 10 MG tablet Take 10 mg by mouth every morning.  1   No current facility-administered medications for this visit.    REVIEW OF SYSTEMS:   Constitutional: Denies fevers,  chills or abnormal night sweats Eyes: Denies blurriness of vision, double vision or watery eyes Ears, nose, mouth, throat, and face: Denies mucositis or sore throat Respiratory: Denies cough, dyspnea or wheezes Cardiovascular: Denies palpitation, chest discomfort or lower extremity swelling Gastrointestinal:  Denies nausea, heartburn or change in bowel habits Skin: Denies abnormal skin rashes Lymphatics: Denies new lymphadenopathy or easy bruising Neurological:Denies numbness, tingling or new weaknesses Behavioral/Psych: Mood is stable, no new changes  Breast: Denies any palpable lumps or discharge All other systems were reviewed with the patient and are negative.  PHYSICAL EXAMINATION: ECOG PERFORMANCE STATUS: 0 - Asymptomatic  Vitals:   07/31/19 1545  BP: (!) 147/78  Pulse: 86  Resp: 18  Temp: 98.5 F (36.9 C)  SpO2: 100%   Filed Weights   07/31/19 1545  Weight: 207 lb 6.4 oz (94.1 kg)    GENERAL:alert, no distress and comfortable SKIN: skin color, texture, turgor are normal, no rashes or significant lesions EYES: normal, conjunctiva are pink and non-injected, sclera clear OROPHARYNX:no exudate, no erythema and lips, buccal mucosa, and tongue normal  NECK: supple, thyroid normal size, non-tender, without nodularity LYMPH:  no palpable lymphadenopathy in the cervical, axillary or inguinal LUNGS: clear to auscultation and percussion with normal breathing effort HEART: regular rate & rhythm and no murmurs and no lower extremity edema ABDOMEN:abdomen soft, non-tender and normal bowel sounds Musculoskeletal:no cyanosis of digits and no clubbing  PSYCH: alert & oriented x 3 with fluent speech NEURO: no focal motor/sensory deficits  LABORATORY DATA:  I have reviewed the data as listed Lab Results  Component Value Date   WBC 6.5 01/16/2015   HGB 13.0 01/16/2015   HCT 39.6 01/16/2015   MCV 90.2 01/16/2015   PLT 191 01/16/2015   Lab Results  Component Value Date   NA  144 10/18/2014   K 3.8 10/18/2014   CO2 25 10/18/2014    RADIOGRAPHIC STUDIES: I have personally reviewed the radiological reports and agreed with the findings in the report.  ASSESSMENT AND PLAN:  History of iron deficiency anemia Patient was seen by Dr. Alen Blew in 2016 when she was having iron deficiency anemia due to heavy menstrual cycles.  This has subsequently subsided and she was no longer iron deficient and was not seen by [place] for several years.  Lab review 06/26/2019: Hemoglobin 11.5, MCV 87.9  These labs do not indicate iron deficiency. Plan: Recheck CBC with differential and obtain iron studies, 123456, folic acid.  Patient describes fatigue that is more than her usual.  We will also check 123456 and folic acid along with thyroid function test.  I will call her tomorrow with the results of this test and we will go from there.     All  questions were answered. The patient knows to call the clinic with any problems, questions or concerns.   Rulon Eisenmenger, MD, MPH 07/31/2019    I, Molly Dorshimer, am acting as scribe for Nicholas Lose, MD.  I have reviewed the above documentation for accuracy and completeness, and I agree with the above.    Patient Care Team: Lucianne Lei, MD as PCP - General (Family Medicine)  DIAGNOSIS:  Encounter Diagnoses  Name Primary?  . Iron deficiency anemia, unspecified iron deficiency anemia type Yes  . Other fatigue   . History of iron deficiency anemia     SUMMARY OF ONCOLOGIC HISTORY: Oncology History   No history exists.    CHIEF COMPLIANT:   INTERVAL HISTORY: Marvine A Rajski is a   ALLERGIES:  is allergic to voltaren [diclofenac sodium].  MEDICATIONS:  Current Outpatient Medications  Medication Sig Dispense Refill  . ascorbic acid (VITAMIN C) 1000 MG tablet Take 1,000 mg by mouth daily.    . fluticasone (FLONASE) 50 MCG/ACT nasal spray 1 spray. One spray in each nostril twice a day    . Iron-FA-B Cmp-C-Biot-Probiotic (FUSION  PLUS) CAPS Take 1 capsule by mouth daily.    Marland Kitchen losartan (COZAAR) 50 MG tablet Take 50 mg by mouth daily.    Marland Kitchen losartan-hydrochlorothiazide (HYZAAR) 50-12.5 MG tablet Take by mouth.    . meloxicam (MOBIC) 15 MG tablet Take 15 mg by mouth daily.    . metFORMIN (GLUCOPHAGE) 500 MG tablet Take 2 tablets by mouth daily.    . montelukast (SINGULAIR) 10 MG tablet Take 10 mg by mouth daily.    . predniSONE (DELTASONE) 10 MG tablet Take 10 mg by mouth every morning.  1   No current facility-administered medications for this visit.    PHYSICAL EXAMINATION: ECOG PERFORMANCE STATUS: 1 - Symptomatic but completely ambulatory  Vitals:   07/31/19 1545  BP: (!) 147/78  Pulse: 86  Resp: 18  Temp: 98.5 F (36.9 C)  SpO2: 100%   Filed Weights   07/31/19 1545  Weight: 207 lb 6.4 oz (94.1 kg)       LABORATORY DATA:  I have reviewed the data as listed CMP Latest Ref Rng & Units 10/18/2014 09/11/2010  Glucose 70 - 140 mg/dl 100 79  BUN 7.0 - 26.0 mg/dL 11.5 -  Creatinine 0.6 - 1.1 mg/dL 0.7 -  Sodium 136 - 145 mEq/L 144 140  Potassium 3.5 - 5.1 mEq/L 3.8 4.3  CO2 22 - 29 mEq/L 25 -  Calcium 8.4 - 10.4 mg/dL 8.7 -  Total Protein 6.4 - 8.3 g/dL 6.8 -  Total Bilirubin 0.20 - 1.20 mg/dL 0.25 -  Alkaline Phos 40 - 150 U/L 74 -  AST 5 - 34 U/L 24 -  ALT 0 - 55 U/L 22 -    Lab Results  Component Value Date   WBC 6.5 01/16/2015   HGB 13.0 01/16/2015   HCT 39.6 01/16/2015   MCV 90.2 01/16/2015   PLT 191 01/16/2015   NEUTROABS 3.4 01/16/2015    ASSESSMENT & PLAN:  History of iron deficiency anemia Patient was seen by Dr. Alen Blew in 2016 when she was having iron deficiency anemia due to heavy menstrual cycles.  This has subsequently subsided and she was no longer iron deficient and was not seen by [place] for several years.  Lab review 06/26/2019: Hemoglobin 11.5, MCV 87.9  These labs do not indicate iron deficiency. Plan: Recheck CBC with differential and obtain iron studies, 123456, folic  acid.  Patient describes fatigue that is more than her usual.  We will also check 123456 and folic acid along with thyroid function test. Other etiologies for fatigue were ruled out with normal kidney function and liver function and electrolytes.  I will call her tomorrow with the results of this test and we will go from there.   Orders Placed This Encounter  Procedures  . CBC with Differential (Cancer Center Only)    Standing Status:   Future    Number of Occurrences:   1    Standing Expiration Date:   07/30/2020  . Iron and TIBC    Standing Status:   Future    Number of Occurrences:   1    Standing Expiration Date:   07/30/2020  . Ferritin    Standing Status:   Future    Number of Occurrences:   1    Standing Expiration Date:   07/30/2020  . Folate, Serum    Standing Status:   Future    Number of Occurrences:   1    Standing Expiration Date:   07/30/2020  . Vitamin B12    Standing Status:   Future    Number of Occurrences:   1    Standing Expiration Date:   07/30/2020  . Thyroid Panel With TSH    Standing Status:   Future    Number of Occurrences:   1    Standing Expiration Date:   07/30/2020   The patient has a good understanding of the overall plan. she agrees with it. she will call with any problems that may develop before the next visit here. Total time spent: 30 mins including face to face time and time spent for planning, charting and co-ordination of care   Harriette Ohara, MD 07/31/19

## 2019-07-31 ENCOUNTER — Other Ambulatory Visit: Payer: Self-pay

## 2019-07-31 ENCOUNTER — Inpatient Hospital Stay: Payer: BC Managed Care – PPO

## 2019-07-31 ENCOUNTER — Inpatient Hospital Stay: Payer: BC Managed Care – PPO | Attending: Hematology and Oncology | Admitting: Hematology and Oncology

## 2019-07-31 VITALS — BP 147/78 | HR 86 | Temp 98.5°F | Resp 18 | Ht 65.0 in | Wt 207.4 lb

## 2019-07-31 DIAGNOSIS — N920 Excessive and frequent menstruation with regular cycle: Secondary | ICD-10-CM | POA: Diagnosis not present

## 2019-07-31 DIAGNOSIS — D500 Iron deficiency anemia secondary to blood loss (chronic): Secondary | ICD-10-CM

## 2019-07-31 DIAGNOSIS — R5383 Other fatigue: Secondary | ICD-10-CM

## 2019-07-31 DIAGNOSIS — E7800 Pure hypercholesterolemia, unspecified: Secondary | ICD-10-CM

## 2019-07-31 DIAGNOSIS — Z7984 Long term (current) use of oral hypoglycemic drugs: Secondary | ICD-10-CM

## 2019-07-31 DIAGNOSIS — Z791 Long term (current) use of non-steroidal anti-inflammatories (NSAID): Secondary | ICD-10-CM

## 2019-07-31 DIAGNOSIS — D509 Iron deficiency anemia, unspecified: Secondary | ICD-10-CM

## 2019-07-31 DIAGNOSIS — Z7952 Long term (current) use of systemic steroids: Secondary | ICD-10-CM

## 2019-07-31 DIAGNOSIS — Z862 Personal history of diseases of the blood and blood-forming organs and certain disorders involving the immune mechanism: Secondary | ICD-10-CM | POA: Insufficient documentation

## 2019-07-31 DIAGNOSIS — Z79899 Other long term (current) drug therapy: Secondary | ICD-10-CM

## 2019-07-31 LAB — CBC WITH DIFFERENTIAL (CANCER CENTER ONLY)
Abs Immature Granulocytes: 0.02 10*3/uL (ref 0.00–0.07)
Basophils Absolute: 0 10*3/uL (ref 0.0–0.1)
Basophils Relative: 0 %
Eosinophils Absolute: 0 10*3/uL (ref 0.0–0.5)
Eosinophils Relative: 0 %
HCT: 38.2 % (ref 36.0–46.0)
Hemoglobin: 12.1 g/dL (ref 12.0–15.0)
Immature Granulocytes: 0 %
Lymphocytes Relative: 40 %
Lymphs Abs: 3.1 10*3/uL (ref 0.7–4.0)
MCH: 29.9 pg (ref 26.0–34.0)
MCHC: 31.7 g/dL (ref 30.0–36.0)
MCV: 94.3 fL (ref 80.0–100.0)
Monocytes Absolute: 0.5 10*3/uL (ref 0.1–1.0)
Monocytes Relative: 6 %
Neutro Abs: 4.1 10*3/uL (ref 1.7–7.7)
Neutrophils Relative %: 54 %
Platelet Count: 200 10*3/uL (ref 150–400)
RBC: 4.05 MIL/uL (ref 3.87–5.11)
RDW: 14.5 % (ref 11.5–15.5)
WBC Count: 7.7 10*3/uL (ref 4.0–10.5)
nRBC: 0 % (ref 0.0–0.2)

## 2019-07-31 LAB — FOLATE: Folate: 22.6 ng/mL (ref >5.9)

## 2019-07-31 LAB — VITAMIN B12: Vitamin B-12: 330 pg/mL (ref 180–914)

## 2019-07-31 NOTE — Assessment & Plan Note (Signed)
Patient was seen by Dr. Alen Blew in 2016 when she was having iron deficiency anemia due to heavy menstrual cycles.  This has subsequently subsided and she was no longer iron deficient and was not seen by [place] for several years.  Lab review 06/26/2019: Hemoglobin 11.5, MCV 87.9  These labs do not indicate iron deficiency. Plan: Recheck CBC with differential and obtain iron studies, 123456, folic acid.  Patient describes fatigue that is more than her usual.  We will also check 123456 and folic acid along with thyroid function test.  I will call her tomorrow with the results of this test and we will go from there.

## 2019-08-01 LAB — IRON AND TIBC
Iron: 54 ug/dL (ref 41–142)
Saturation Ratios: 19 % — ABNORMAL LOW (ref 21–57)
TIBC: 279 ug/dL (ref 236–444)
UIBC: 225 ug/dL (ref 120–384)

## 2019-08-01 LAB — THYROID PANEL WITH TSH
Free Thyroxine Index: 1.9 (ref 1.2–4.9)
T3 Uptake Ratio: 26 % (ref 24–39)
T4, Total: 7.4 ug/dL (ref 4.5–12.0)
TSH: 0.484 u[IU]/mL (ref 0.450–4.500)

## 2019-08-01 LAB — FERRITIN: Ferritin: 67 ng/mL (ref 11–307)

## 2019-08-03 ENCOUNTER — Telehealth: Payer: Self-pay | Admitting: Hematology and Oncology

## 2019-08-03 NOTE — Telephone Encounter (Signed)
I left a voicemail for the patient to inform her that her hemoglobin and iron studies as well as 123456 and folic acid levels were all normal.  There is no indication for IV iron replacement therapy.  No indication for 123456 or folic acid replacement either. I instructed her to call us if she has any questions or concerns.

## 2019-10-26 DIAGNOSIS — I10 Essential (primary) hypertension: Secondary | ICD-10-CM | POA: Diagnosis not present

## 2020-05-25 ENCOUNTER — Other Ambulatory Visit: Payer: Self-pay

## 2020-05-25 ENCOUNTER — Ambulatory Visit (HOSPITAL_COMMUNITY)
Admission: EM | Admit: 2020-05-25 | Discharge: 2020-05-25 | Disposition: A | Discharge status: Home / Self Care | Payer: BC Managed Care – PPO | Attending: Family Medicine | Admitting: Family Medicine

## 2020-05-25 ENCOUNTER — Encounter (HOSPITAL_COMMUNITY): Payer: Self-pay

## 2020-05-25 DIAGNOSIS — M19041 Primary osteoarthritis, right hand: Secondary | ICD-10-CM | POA: Diagnosis not present

## 2020-05-25 DIAGNOSIS — M79644 Pain in right finger(s): Secondary | ICD-10-CM

## 2020-05-25 MED ORDER — PREDNISONE 20 MG PO TABS: 40.0000 mg | ORAL_TABLET | Freq: Every day | ORAL | 0 refills | Status: DC

## 2020-05-25 MED ORDER — ACETAMINOPHEN-CODEINE #3 300-30 MG PO TABS: 1.0000 | ORAL_TABLET | Freq: Four times a day (QID) | ORAL | 0 refills | Status: DC | PRN

## 2020-05-25 NOTE — ED Provider Notes (Addendum)
[facility]    CSN: [id number] Arrival date & time: 05/25/20  1238      History   Chief Complaint Chief Complaint  Patient presents with  . Finger Pain & Swelling    HPI Selena Huff is a 59 y.o. female.   HPI  Patient presents today with over 2 weeks of right middle digit swelling and pain.  She denies any injury.  She reports a similar course of symptoms developed about 6 to 7 years ago and she was treated for suspected gout with anti-inflammatory medication and symptoms resolved.  She has not had any issues with the finger since that time.  She denies any other aches or injuries.  There is no wound on the finger.  She endorses his aggression increased in swelling and tenderness most prominent at the joints.  And on yesterday the finger became increasingly more warm to touch.  She has applied ice which is temporary relieved the severity of the pain although swelling has progressed  Past Medical History:  Diagnosis Date  . Allergy     Patient Active Problem List   Diagnosis Date Noted  . History of iron deficiency anemia 07/31/2019  . Fatigue 07/31/2019  . Spinal stenosis of lumbar region with neurogenic claudication 04/27/2017  . Radiculopathy due to lumbar intervertebral disc disorder 04/27/2017  . Lumbar radiculopathy 04/01/2017    History reviewed. No pertinent surgical history.  OB History   No obstetric history on file.      Home Medications    Prior to Admission medications   Medication Sig Start Date End Date Taking? Authorizing Provider  acetaminophen-codeine (TYLENOL #3) 300-30 MG tablet Take 1-2 tablets by mouth every 6 (six) hours as needed for moderate pain. 05/25/20   Scot Jun, FNP  ascorbic acid (VITAMIN C) 1000 MG tablet Take 1,000 mg by mouth daily. 10/18/14   [provider]  fluticasone (FLONASE) 50 MCG/ACT nasal spray 1 spray. One spray in each nostril twice a day 09/04/14   [provider]  Iron-FA-B  Cmp-C-Biot-Probiotic (FUSION PLUS) CAPS Take 1 capsule by mouth daily. 09/16/14   [provider]  losartan (COZAAR) 50 MG tablet Take 50 mg by mouth daily.    [provider]  losartan-hydrochlorothiazide (HYZAAR) 50-12.5 MG tablet Take by mouth.    [provider]  meloxicam (MOBIC) 15 MG tablet Take 15 mg by mouth daily. 10/08/14   [provider]  metFORMIN (GLUCOPHAGE) 500 MG tablet Take 2 tablets by mouth daily. 09/26/14   [provider]  montelukast (SINGULAIR) 10 MG tablet Take 10 mg by mouth daily. 10/18/14   [provider]  predniSONE (DELTASONE) 20 MG tablet Take 2 tablets (40 mg total) by mouth daily with breakfast. 05/25/20   Scot Jun, FNP    Family History Family History  Family history unknown: Yes    Social History Social History   Tobacco Use  . Smoking status: Never Smoker  . Smokeless tobacco: Never Used  Substance Use Topics  . Alcohol use: Not on file  . Drug use: Not on file     Allergies   Voltaren [diclofenac sodium]   Review of Systems Review of Systems Pertinent negatives listed in HPI  Physical Exam Triage Vital Signs ED Triage Vitals  Enc Vitals Group     BP 05/25/20 1353 132/84     Pulse Rate 05/25/20 1353 75     Resp 05/25/20 1353 18     Temp 05/25/20  1353 98.3 F (36.8 C)     Temp Source 05/25/20 1353 Oral     SpO2 05/25/20 1353 98 %     Weight --      Height --      Head Circumference --      Peak Flow --      Pain Score 05/25/20 1354 8     Pain Loc --      Pain Edu? --      Excl. in [place]? --    No data found.  Updated Vital Signs BP 132/84 (BP Location: Right Arm)   Pulse 75   Temp 98.3 F (36.8 C) (Oral)   Resp 18   SpO2 98%   Visual Acuity Right Eye Distance:   Left Eye Distance:   Bilateral Distance:    Right Eye Near:   Left Eye Near:    Bilateral Near:     Physical Exam Vitals reviewed.  Constitutional:      Appearance: Normal appearance.    Cardiovascular:     Rate and Rhythm: Normal rate and regular rhythm.  Musculoskeletal:     Right hand: Normal.       Arms:     Cervical back: Normal range of motion.  Skin:    General: Skin is warm.  Neurological:     General: No focal deficit present.     Mental Status: She is alert and oriented to person, place, and time.  Psychiatric:        Mood and Affect: Mood normal.        Behavior: Behavior normal.        Thought Content: Thought content normal.        Judgment: Judgment normal.      UC Treatments / Results  Labs (all labs ordered are listed, but only abnormal results are displayed) Labs Reviewed - No data to display  EKG   Radiology No results found.  Procedures Procedures (including critical care time)  Medications Ordered in UC Medications - No data to display  Initial Impression / Assessment and Plan / UC Course  I have reviewed the triage vital signs and the nursing notes.  Pertinent labs & imaging results that were available during my care of the patient were reviewed by me and considered in my medical decision making (see chart for details).     Treating for acute inflammation involving the right middle finger unable to completely rule out possibility of gout, however if recurs to advise patient it is necessary to follow-up with primary care provider for further work-up to rule out any type of other pathologies causing symptoms.  We will treat today with a short course of prednisone and Tylenol with codeine for pain given patient has an intolerance to NSAIDs.  Precautions given to avoid driving while taking pain medications.  Patient verbalized understanding agreement with plan.   Final Clinical Impressions(s) / UC Diagnoses   Final diagnoses:  Inflammation of joint of finger of right hand  Pain of finger of right hand middle     Discharge Instructions     Start prednisone take as directed with food daily for the next 5 days.  For pain I have  prescribed you Tylenol with codeine avoid driving while taking medication as this can cause severe drowsiness.  If symptoms worsen or do not improve follow-up with primary care provider for further work-up and evaluation.   ED Prescriptions    Medication Sig Dispense Auth. Provider   acetaminophen-codeine (  TYLENOL #3) 300-30 MG tablet Take 1-2 tablets by mouth every 6 (six) hours as needed for moderate pain. 15 tablet Scot Jun, FNP   predniSONE (DELTASONE) 20 MG tablet Take 2 tablets (40 mg total) by mouth daily with breakfast. 10 tablet Scot Jun, FNP     I have reviewed the PDMP during this encounter.   Scot Jun, FNP 05/25/20 1503    Scot Jun, FNP 05/25/20 [phone]

## 2020-05-25 NOTE — ED Triage Notes (Signed)
Pt presents with swelling & pain in right middle finger X 2 weeks.

## 2020-05-25 NOTE — Discharge Instructions (Signed)
Start prednisone take as directed with food daily for the next 5 days.  For pain I have prescribed you Tylenol with codeine avoid driving while taking medication as this can cause severe drowsiness.  If symptoms worsen or do not improve follow-up with primary care provider for further work-up and evaluation.

## 2020-06-12 DIAGNOSIS — E785 Hyperlipidemia, unspecified: Secondary | ICD-10-CM | POA: Diagnosis not present

## 2020-06-12 DIAGNOSIS — I10 Essential (primary) hypertension: Secondary | ICD-10-CM | POA: Diagnosis not present

## 2020-06-27 DIAGNOSIS — Z0000 Encounter for general adult medical examination without abnormal findings: Secondary | ICD-10-CM | POA: Diagnosis not present

## 2020-06-27 DIAGNOSIS — Z1272 Encounter for screening for malignant neoplasm of vagina: Secondary | ICD-10-CM | POA: Diagnosis not present

## 2020-06-28 DIAGNOSIS — Z1231 Encounter for screening mammogram for malignant neoplasm of breast: Secondary | ICD-10-CM | POA: Diagnosis not present

## 2020-10-02 DIAGNOSIS — E785 Hyperlipidemia, unspecified: Secondary | ICD-10-CM | POA: Diagnosis not present

## 2020-10-02 DIAGNOSIS — E1140 Type 2 diabetes mellitus with diabetic neuropathy, unspecified: Secondary | ICD-10-CM | POA: Diagnosis not present

## 2020-10-02 DIAGNOSIS — E1169 Type 2 diabetes mellitus with other specified complication: Secondary | ICD-10-CM | POA: Diagnosis not present

## 2020-10-02 DIAGNOSIS — I10 Essential (primary) hypertension: Secondary | ICD-10-CM | POA: Diagnosis not present

## 2020-10-24 DIAGNOSIS — J301 Allergic rhinitis due to pollen: Secondary | ICD-10-CM | POA: Diagnosis not present

## 2020-10-24 DIAGNOSIS — I10 Essential (primary) hypertension: Secondary | ICD-10-CM | POA: Diagnosis not present

## 2020-10-24 DIAGNOSIS — E1169 Type 2 diabetes mellitus with other specified complication: Secondary | ICD-10-CM | POA: Diagnosis not present

## 2020-10-24 DIAGNOSIS — E1165 Type 2 diabetes mellitus with hyperglycemia: Secondary | ICD-10-CM | POA: Diagnosis not present

## 2021-01-23 DIAGNOSIS — E1165 Type 2 diabetes mellitus with hyperglycemia: Secondary | ICD-10-CM | POA: Diagnosis not present

## 2021-01-23 DIAGNOSIS — E1169 Type 2 diabetes mellitus with other specified complication: Secondary | ICD-10-CM | POA: Diagnosis not present

## 2021-01-23 DIAGNOSIS — I10 Essential (primary) hypertension: Secondary | ICD-10-CM | POA: Diagnosis not present

## 2021-01-23 DIAGNOSIS — E785 Hyperlipidemia, unspecified: Secondary | ICD-10-CM | POA: Diagnosis not present

## 2021-03-11 DIAGNOSIS — E1169 Type 2 diabetes mellitus with other specified complication: Secondary | ICD-10-CM | POA: Diagnosis not present

## 2021-03-11 DIAGNOSIS — E785 Hyperlipidemia, unspecified: Secondary | ICD-10-CM | POA: Diagnosis not present

## 2021-03-11 DIAGNOSIS — I10 Essential (primary) hypertension: Secondary | ICD-10-CM | POA: Diagnosis not present

## 2021-05-14 DIAGNOSIS — Z1231 Encounter for screening mammogram for malignant neoplasm of breast: Secondary | ICD-10-CM | POA: Diagnosis not present

## 2021-06-28 DIAGNOSIS — I10 Essential (primary) hypertension: Secondary | ICD-10-CM | POA: Diagnosis not present

## 2021-06-28 DIAGNOSIS — E1165 Type 2 diabetes mellitus with hyperglycemia: Secondary | ICD-10-CM | POA: Diagnosis not present

## 2021-06-28 DIAGNOSIS — E1169 Type 2 diabetes mellitus with other specified complication: Secondary | ICD-10-CM | POA: Diagnosis not present

## 2021-07-01 ENCOUNTER — Other Ambulatory Visit: Payer: Self-pay | Admitting: Family Medicine

## 2021-07-01 ENCOUNTER — Other Ambulatory Visit (HOSPITAL_COMMUNITY)
Admission: RE | Admit: 2021-07-01 | Discharge: 2021-07-01 | Disposition: A | Discharge status: Home / Self Care | Payer: BC Managed Care – PPO | Source: Ambulatory Visit | Attending: Family Medicine | Admitting: Family Medicine

## 2021-07-01 DIAGNOSIS — E785 Hyperlipidemia, unspecified: Secondary | ICD-10-CM | POA: Diagnosis not present

## 2021-07-01 DIAGNOSIS — Z124 Encounter for screening for malignant neoplasm of cervix: Secondary | ICD-10-CM | POA: Insufficient documentation

## 2021-07-01 DIAGNOSIS — Z0000 Encounter for general adult medical examination without abnormal findings: Secondary | ICD-10-CM | POA: Diagnosis not present

## 2021-07-01 DIAGNOSIS — I10 Essential (primary) hypertension: Secondary | ICD-10-CM | POA: Diagnosis not present

## 2021-07-09 LAB — CYTOLOGY - PAP
Comment: NEGATIVE
Diagnosis: NEGATIVE
High risk HPV: NEGATIVE

## 2021-09-03 ENCOUNTER — Other Ambulatory Visit (HOSPITAL_COMMUNITY): Payer: Self-pay | Admitting: Radiology

## 2021-09-03 DIAGNOSIS — R0602 Shortness of breath: Secondary | ICD-10-CM

## 2021-10-16 ENCOUNTER — Ambulatory Visit (HOSPITAL_COMMUNITY)
Admission: RE | Admit: 2021-10-16 | Discharge: 2021-10-16 | Disposition: A | Discharge status: Home / Self Care | Payer: BC Managed Care – PPO | Source: Ambulatory Visit | Attending: Family Medicine | Admitting: Family Medicine

## 2021-10-16 DIAGNOSIS — R0602 Shortness of breath: Secondary | ICD-10-CM | POA: Diagnosis present

## 2021-10-16 LAB — PULMONARY FUNCTION TEST
DL/VA % pred: 108 %
DL/VA: 4.6 ml/min/mmHg/L
DLCO unc % pred: 67 %
DLCO unc: 13.65 ml/min/mmHg
FEF 25-75 Post: 3.8 L/sec
FEF 25-75 Pre: 3.39 L/sec
FEF2575-%Change-Post: 12 %
FEF2575-%Pred-Post: 180 %
FEF2575-%Pred-Pre: 160 %
FEV1-%Change-Post: 2 %
FEV1-%Pred-Post: 88 %
FEV1-%Pred-Pre: 86 %
FEV1-Post: 1.87 L
FEV1-Pre: 1.82 L
FEV1FVC-%Change-Post: 0 %
FEV1FVC-%Pred-Pre: 114 %
FEV6-%Change-Post: 2 %
FEV6-%Pred-Post: 79 %
FEV6-%Pred-Pre: 77 %
FEV6-Post: 2.05 L
FEV6-Pre: 2 L
FEV6FVC-%Change-Post: 0 %
FEV6FVC-%Pred-Post: 103 %
FEV6FVC-%Pred-Pre: 103 %
FVC-%Change-Post: 2 %
FVC-%Pred-Post: 76 %
FVC-%Pred-Pre: 74 %
FVC-Post: 2.05 L
FVC-Pre: 2 L
Post FEV1/FVC ratio: 91 %
Post FEV6/FVC ratio: 100 %
Pre FEV1/FVC ratio: 91 %
Pre FEV6/FVC Ratio: 100 %
RV % pred: 66 %
RV: 1.32 L
TLC % pred: 69 %
TLC: 3.51 L

## 2021-10-16 MED ORDER — ALBUTEROL SULFATE (2.5 MG/3ML) 0.083% IN NEBU
2.5000 mg | INHALATION_SOLUTION | Freq: Once | RESPIRATORY_TRACT | Status: AC
  Administered 2021-10-16: 2.5 mg via RESPIRATORY_TRACT

## 2022-02-17 ENCOUNTER — Ambulatory Visit
Admission: RE | Admit: 2022-02-17 | Discharge: 2022-02-17 | Disposition: A | Discharge status: Home / Self Care | Payer: BC Managed Care – PPO | Source: Ambulatory Visit | Attending: Family Medicine | Admitting: Family Medicine

## 2022-02-17 ENCOUNTER — Other Ambulatory Visit: Payer: Self-pay | Admitting: Family Medicine

## 2022-02-17 DIAGNOSIS — M130 Polyarthritis, unspecified: Secondary | ICD-10-CM

## 2022-09-21 ENCOUNTER — Other Ambulatory Visit: Payer: Self-pay | Admitting: Family Medicine

## 2022-09-21 ENCOUNTER — Ambulatory Visit
Admission: RE | Admit: 2022-09-21 | Discharge: 2022-09-21 | Disposition: A | Discharge status: Home / Self Care | Payer: BC Managed Care – PPO | Source: Ambulatory Visit | Attending: Family Medicine | Admitting: Family Medicine

## 2022-09-21 DIAGNOSIS — M542 Cervicalgia: Secondary | ICD-10-CM

## 2022-11-10 ENCOUNTER — Ambulatory Visit
Admission: RE | Admit: 2022-11-10 | Discharge: 2022-11-10 | Disposition: A | Discharge status: Home / Self Care | Payer: BC Managed Care – PPO | Source: Ambulatory Visit | Attending: Family Medicine | Admitting: Family Medicine

## 2022-11-10 ENCOUNTER — Other Ambulatory Visit: Payer: Self-pay | Admitting: Family Medicine

## 2022-11-10 DIAGNOSIS — R221 Localized swelling, mass and lump, neck: Secondary | ICD-10-CM

## 2023-06-16 ENCOUNTER — Other Ambulatory Visit: Payer: Self-pay | Admitting: Family Medicine

## 2023-06-16 ENCOUNTER — Other Ambulatory Visit (HOSPITAL_COMMUNITY)
Admission: RE | Admit: 2023-06-16 | Discharge: 2023-06-16 | Disposition: A | Discharge status: Home / Self Care | Payer: BC Managed Care – PPO | Source: Ambulatory Visit | Attending: Family Medicine | Admitting: Family Medicine

## 2023-06-16 DIAGNOSIS — Z01419 Encounter for gynecological examination (general) (routine) without abnormal findings: Secondary | ICD-10-CM | POA: Diagnosis present

## 2023-06-16 DIAGNOSIS — Z0001 Encounter for general adult medical examination with abnormal findings: Secondary | ICD-10-CM | POA: Diagnosis present

## 2023-06-21 LAB — CYTOLOGY - PAP
Chlamydia: NEGATIVE
Comment: NEGATIVE
Comment: NEGATIVE
Comment: NEGATIVE
Comment: NORMAL
Diagnosis: NEGATIVE
HSV1: NEGATIVE
HSV2: NEGATIVE
High risk HPV: NEGATIVE
Neisseria Gonorrhea: NEGATIVE

## 2023-08-15 ENCOUNTER — Ambulatory Visit (INDEPENDENT_AMBULATORY_CARE_PROVIDER_SITE_OTHER): Payer: BC Managed Care – PPO

## 2023-08-15 ENCOUNTER — Ambulatory Visit (HOSPITAL_COMMUNITY)
Admission: EM | Admit: 2023-08-15 | Discharge: 2023-08-15 | Disposition: A | Discharge status: Home / Self Care | Payer: BC Managed Care – PPO

## 2023-08-15 ENCOUNTER — Encounter (HOSPITAL_COMMUNITY): Payer: Self-pay

## 2023-08-15 DIAGNOSIS — S92424A Nondisplaced fracture of distal phalanx of right great toe, initial encounter for closed fracture: Secondary | ICD-10-CM | POA: Diagnosis not present

## 2023-08-15 DIAGNOSIS — S92411A Displaced fracture of proximal phalanx of right great toe, initial encounter for closed fracture: Secondary | ICD-10-CM

## 2023-08-15 NOTE — Discharge Instructions (Addendum)
Right great fracture of the distal and proximal phalanx. The proximal may have a slight displacement. This will require follow up with an orthopedist. We will treat with the following:  Post-op shoe when up walking but may remove at night and with showering. Avoid long distance walking, jumping or other heavy activities on the right foot until you follow up with orthopedics Call orthopedics first thing tomorrow to get an appointment in order to define restrictions for work.  Continue to ice and use tylenol for pain.  Return to urgent care or PCP if symptoms worsen or fail to resolve.

## 2023-08-15 NOTE — ED Provider Notes (Signed)
[facility]    CSN: [id number] Arrival date & time: 08/15/23  1016      History   Chief Complaint Chief Complaint  Patient presents with   Fall    HPI Selena Huff is a 63 y.o. female.   63 y.o. female who presents to urgent care with complaints of right great toe pain and swelling.  She reports that 4 days ago she tripped and fell in the bathroom.  She is unsure exactly how she fell but she knows that she hurt her right great toe.  She denies any other injury.  She has been using ice and Tylenol for the symptoms.  She is unable to take ibuprofen.  It is painful to walk on but she is able to.  She denies any previous injury to the area.  She denies fevers or chills.   Fall Pertinent negatives include no chest pain, no abdominal pain and no shortness of breath.    Past Medical History:  Diagnosis Date   Allergy     Patient Active Problem List   Diagnosis Date Noted   History of iron deficiency anemia 07/31/2019   Fatigue 07/31/2019   Spinal stenosis of lumbar region with neurogenic claudication 04/27/2017   Radiculopathy due to lumbar intervertebral disc disorder 04/27/2017   Lumbar radiculopathy 04/01/2017    History reviewed. No pertinent surgical history.  OB History   No obstetric history on file.      Home Medications    Prior to Admission medications   Medication Sig Start Date End Date Taking? Authorizing Provider  Cholecalciferol (VITAMIN D3) 1.25 MG (50000 UT) CAPS SMARTSIG:1 pill By Mouth Once a Week 06/17/23  Yes [provider]  NOREL AD 4-10-325 MG TABS Take 1 tablet by mouth every 6 (six) hours. 08/02/23  Yes [provider]  acetaminophen (TYLENOL) 500 MG tablet Take 500 mg by mouth every 6 (six) hours as needed.    [provider]  fexofenadine-pseudoephedrine (ALLEGRA-D) 60-120 MG 12 hr tablet Take 1 tablet by mouth 2 (two) times daily.    [provider]  fluticasone (FLONASE) 50 MCG/ACT nasal  spray 1 spray. One spray in each nostril twice a day 09/04/14   [provider]  losartan (COZAAR) 50 MG tablet Take 50 mg by mouth daily.    [provider]  losartan-hydrochlorothiazide (HYZAAR) 50-12.5 MG tablet Take by mouth.    [provider]  metFORMIN (GLUCOPHAGE) 500 MG tablet Take 2 tablets by mouth daily. 09/26/14   [provider]  montelukast (SINGULAIR) 10 MG tablet Take 10 mg by mouth daily. 10/18/14   [provider]    Family History Family History  Family history unknown: Yes    Social History Social History   Tobacco Use   Smoking status: Never   Smokeless tobacco: Never  Vaping Use   Vaping status: Never Used  Substance Use Topics   Alcohol use: Never   Drug use: Never     Allergies   Voltaren [diclofenac sodium]   Review of Systems Review of Systems  Constitutional:  Negative for chills and fever.  HENT:  Negative for ear pain and sore throat.   Eyes:  Negative for pain and visual disturbance.  Respiratory:  Negative for cough and shortness of breath.   Cardiovascular:  Negative for chest pain and palpitations.  Gastrointestinal:  Negative for abdominal pain and vomiting.  Genitourinary:  Negative for dysuria and hematuria.  Musculoskeletal:  Negative for arthralgias and  back pain.       Right great toe pain and swelling  Skin:  Negative for color change and rash.  Neurological:  Negative for seizures and syncope.  All other systems reviewed and are negative.    Physical Exam Triage Vital Signs ED Triage Vitals  Encounter Vitals Group     BP 08/15/23 1140 128/81     Systolic BP Percentile --      Diastolic BP Percentile --      Pulse Rate 08/15/23 1140 70     Resp 08/15/23 1140 16     Temp 08/15/23 1140 98.8 F (37.1 C)     Temp Source 08/15/23 1140 Oral     SpO2 08/15/23 1140 98 %     Weight 08/15/23 1140 208 lb (94.3 kg)     Height 08/15/23 1140 5\' 4"  (1.626 m)     Head Circumference --       Peak Flow --      Pain Score 08/15/23 1139 6     Pain Loc --      Pain Education --      Exclude from Growth Chart --    No data found.  Updated Vital Signs BP 128/81 (BP Location: Right Arm)   Pulse 70   Temp 98.8 F (37.1 C) (Oral)   Resp 16   Ht 5\' 4"  (1.626 m)   Wt 208 lb (94.3 kg)   SpO2 98%   BMI 35.70 kg/m   Visual Acuity Right Eye Distance:   Left Eye Distance:   Bilateral Distance:    Right Eye Near:   Left Eye Near:    Bilateral Near:     Physical Exam Vitals and nursing note reviewed.  Constitutional:      General: She is not in acute distress.    Appearance: She is well-developed.  HENT:     Head: Normocephalic and atraumatic.  Eyes:     Conjunctiva/sclera: Conjunctivae normal.  Cardiovascular:     Rate and Rhythm: Normal rate and regular rhythm.     Heart sounds: No murmur heard. Pulmonary:     Effort: Pulmonary effort is normal. No respiratory distress.     Breath sounds: Normal breath sounds.  Abdominal:     Palpations: Abdomen is soft.     Tenderness: There is no abdominal tenderness.  Musculoskeletal:        General: No swelling.     Cervical back: Neck supple.       Feet:  Skin:    General: Skin is warm and dry.     Capillary Refill: Capillary refill takes less than 2 seconds.  Neurological:     Mental Status: She is alert.  Psychiatric:        Mood and Affect: Mood normal.      UC Treatments / Results  Labs (all labs ordered are listed, but only abnormal results are displayed) Labs Reviewed - No data to display  EKG   Radiology DG Toe Great Right Result Date: 08/15/2023 CLINICAL DATA:  Right great toe injury after a fall. EXAM: RIGHT GREAT TOE COMPARISON:  None Available. FINDINGS: An obliquely oriented fracture along the medial head of the first proximal phalanx involves the interphalangeal joint. Fracture fragment is minimally displaced. Overlying soft tissue swelling. IMPRESSION: Fracture involving the head of the first  proximal phalanx with involvement of the interphalangeal joint. Electronically Signed   By: Leanna Battles M.D.   On: 08/15/2023 12:25    Procedures Procedures (  including critical care time)  Medications Ordered in UC Medications - No data to display  Initial Impression / Assessment and Plan / UC Course  I have reviewed the triage vital signs and the nursing notes.  Pertinent labs & imaging results that were available during my care of the patient were reviewed by me and considered in my medical decision making (see chart for details).     Displaced fracture of proximal phalanx of right great toe, initial encounter for closed fracture  Nondisplaced fracture of distal phalanx of right great toe, initial encounter for closed fracture   Right great fracture of the distal and proximal phalanx. The proximal may have a slight displacement. This will require follow up with an orthopedist. We will treat with the following:  Post-op shoe when up walking but may remove at night and with showering. Avoid long distance walking, jumping or other heavy activities on the right foot until you follow up with orthopedics Call orthopedics first thing tomorrow to get an appointment in order to define restrictions for work.  Continue to ice and use tylenol for pain.  Return to urgent care or PCP if symptoms worsen or fail to resolve.    Final Clinical Impressions(s) / UC Diagnoses   Final diagnoses:  Displaced fracture of proximal phalanx of right great toe, initial encounter for closed fracture  Nondisplaced fracture of distal phalanx of right great toe, initial encounter for closed fracture     Discharge Instructions      Right great fracture of the distal and proximal phalanx. The proximal may have a slight displacement. This will require follow up with an orthopedist. We will treat with the following:  Post-op shoe when up walking but may remove at night and with showering. Avoid long  distance walking, jumping or other heavy activities on the right foot until you follow up with orthopedics Call orthopedics first thing tomorrow to get an appointment in order to define restrictions for work.  Continue to ice and use tylenol for pain.  Return to urgent care or PCP if symptoms worsen or fail to resolve.     ED Prescriptions   None    PDMP not reviewed this encounter.   Landis Martins, [place] 08/15/23 1234

## 2023-08-15 NOTE — ED Triage Notes (Signed)
Patient here today with c/o right great toe injury X 4 days from falling. Patient states that she fell when she went to the bathroom. She is not sure how she fell. She has been taking Tylenol with some relief.

## 2023-12-26 ENCOUNTER — Ambulatory Visit (HOSPITAL_COMMUNITY)
Admission: EM | Admit: 2023-12-26 | Discharge: 2023-12-26 | Disposition: A | Discharge status: Home / Self Care | Attending: Physician Assistant | Admitting: Physician Assistant

## 2023-12-26 ENCOUNTER — Encounter (HOSPITAL_COMMUNITY): Payer: Self-pay

## 2023-12-26 DIAGNOSIS — H9201 Otalgia, right ear: Secondary | ICD-10-CM

## 2023-12-26 DIAGNOSIS — R011 Cardiac murmur, unspecified: Secondary | ICD-10-CM | POA: Diagnosis not present

## 2023-12-26 DIAGNOSIS — M26621 Arthralgia of right temporomandibular joint: Secondary | ICD-10-CM

## 2023-12-26 MED ORDER — PREDNISONE 10 MG PO TABS: 30.0000 mg | ORAL_TABLET | Freq: Every day | ORAL | 0 refills | Status: AC

## 2023-12-26 NOTE — Discharge Instructions (Signed)
 We are treating you for inflammation of your TMJ.  Start prednisone  30 mg for 4 days.  Do not take NSAIDs with this medication including aspirin, ibuprofen/Advil, naproxen/Aleve.  Continue allergy medication as previously prescribed.  You can take Tylenol /acetaminophen  for pain relief.  If your symptoms are not improving follow-up with a dentist/oral surgeon for further evaluation and management.  If anything worsens and you have vision change, fever, headache, difficulty opening her jaw you should be seen immediately.  I heard a murmur.  These are usually not a big deal but I would like you to follow-up with your primary care to consider echocardiogram (ultrasound of your heart).  Schedule an appointment with them soon as possible.  If you develop any chest pain, shortness of breath, lightheadedness, weakness, passing out you need to go to the ER.
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
## 2023-12-26 NOTE — ED Triage Notes (Signed)
 Pt states that she has some right ear pain x1 week
